# Patient Record
Sex: Male | Born: 1980
Health system: Southern US, Community
[De-identification: ages and names within clinical notes are randomized; demographics above are authoritative.]

## PROBLEM LIST (undated history)

## (undated) DIAGNOSIS — M549 Dorsalgia, unspecified: Secondary | ICD-10-CM

## (undated) DIAGNOSIS — F909 Attention-deficit hyperactivity disorder, unspecified type: Secondary | ICD-10-CM

## (undated) DIAGNOSIS — J45909 Unspecified asthma, uncomplicated: Secondary | ICD-10-CM

## (undated) DIAGNOSIS — T7840XA Allergy, unspecified, initial encounter: Secondary | ICD-10-CM

## (undated) HISTORY — PX: OTHER SURGICAL HISTORY: SHX169

## (undated) HISTORY — PX: TYMPANOSTOMY TUBE PLACEMENT: SHX32

## (undated) HISTORY — DX: Attention-deficit hyperactivity disorder, unspecified type: F90.9

## (undated) HISTORY — DX: Allergy, unspecified, initial encounter: T78.40XA

## (undated) HISTORY — DX: Unspecified asthma, uncomplicated: J45.909

---
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
## 2005-05-19 ENCOUNTER — Encounter: Admission: RE | Admit: 2005-05-19 | Discharge: 2005-05-19 | Payer: Self-pay | Admitting: Family Medicine

## 2005-09-25 ENCOUNTER — Encounter: Admission: RE | Admit: 2005-09-25 | Discharge: 2005-09-25 | Payer: Self-pay | Admitting: Emergency Medicine

## 2007-04-01 ENCOUNTER — Encounter: Admission: RE | Admit: 2007-04-01 | Discharge: 2007-04-01 | Payer: Self-pay | Admitting: Family Medicine

## 2007-05-08 ENCOUNTER — Encounter: Admission: RE | Admit: 2007-05-08 | Discharge: 2007-05-08 | Payer: Self-pay | Admitting: Family Medicine

## 2007-06-28 IMAGING — CT CT ABDOMEN W/ CM
1 of 5 series · 14 of 36 positions shown, 19 images · IV contrast (READICAT/WATER & [ID] OMNI 300)
Comparison: None.

CLINICAL DATA: Abdominal pain for two months. 
 ABDOMEN CT WITH CONTRAST:
TECHNIQUE: Multidetector CT imaging of the abdomen was performed following the standard protocol during bolus administration of intravenous contrast.
 Contrast:  100 cc Omnipaque 300
TECHNIQUE: Multidetector CT imaging of the pelvis was performed following the standard protocol during bolus administration of intravenous contrast.

[Series 2: routine abdomen · axial · 0.73mm/px · z∈[-411,-56]mm · 14 of 81 slices shown, 19 images]
[im 5/81  soft-tissue]
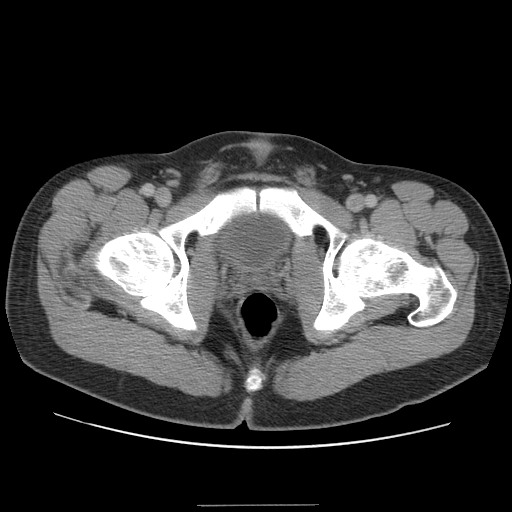
[im 5/81  bone]
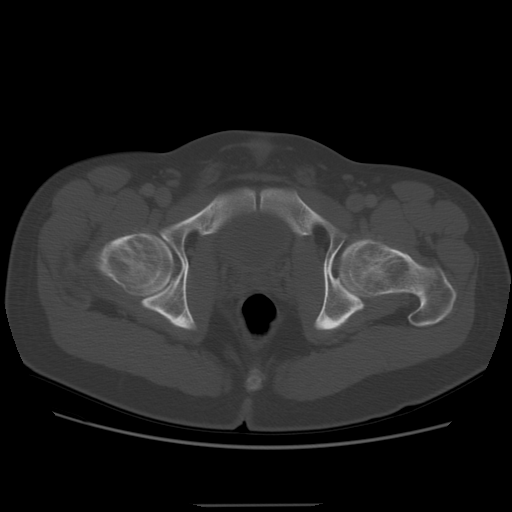
[im 10/81  soft-tissue]
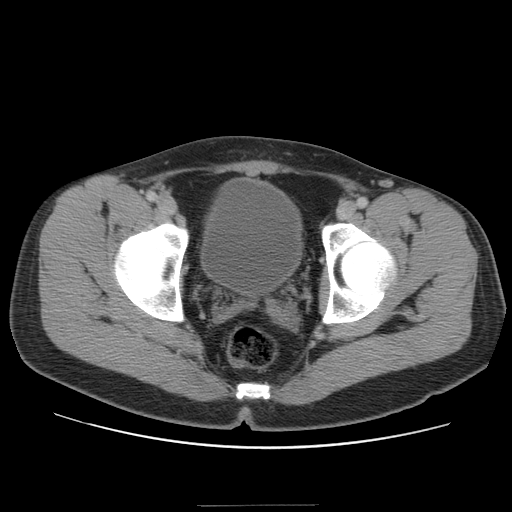
[im 19/81  soft-tissue]
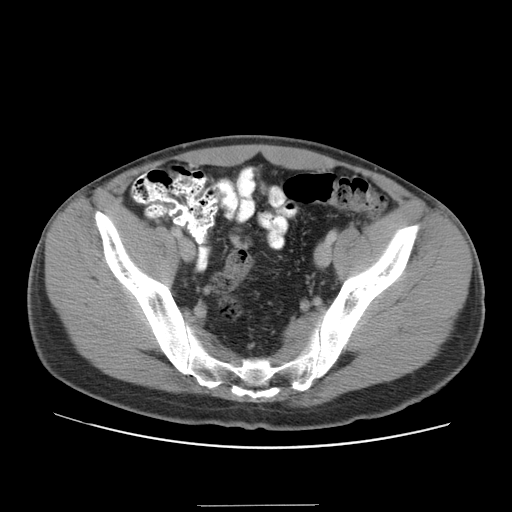
[im 24/81  soft-tissue]
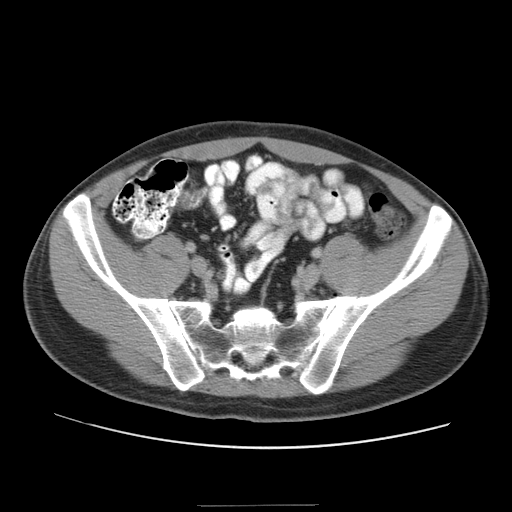
[im 29/81  soft-tissue]
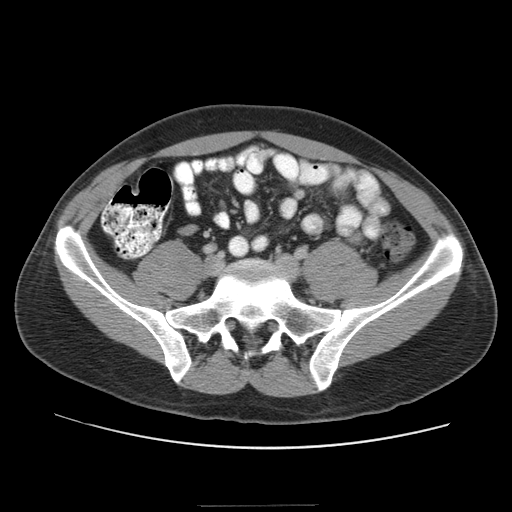
[im 33/81  soft-tissue]
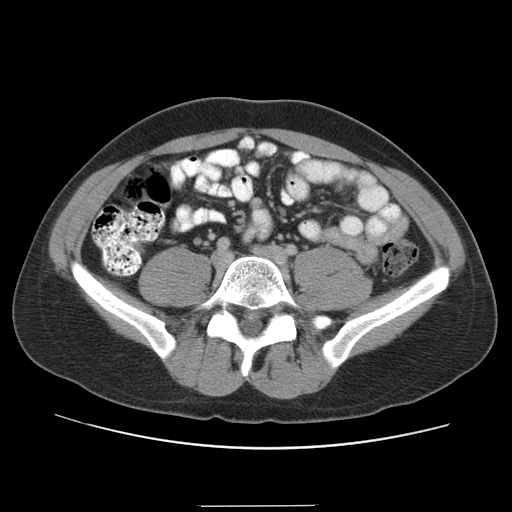
[im 43/81  soft-tissue]
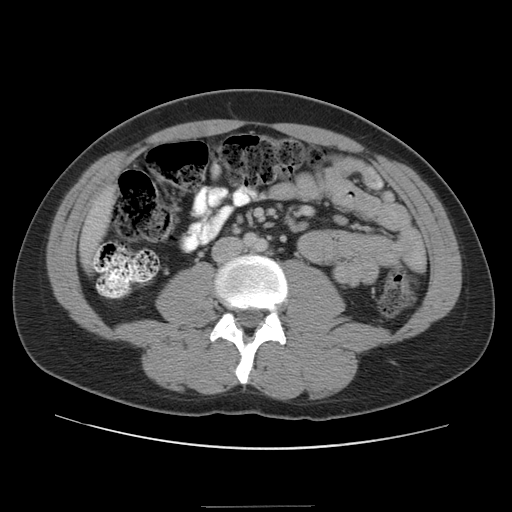
[im 48/81  soft-tissue]
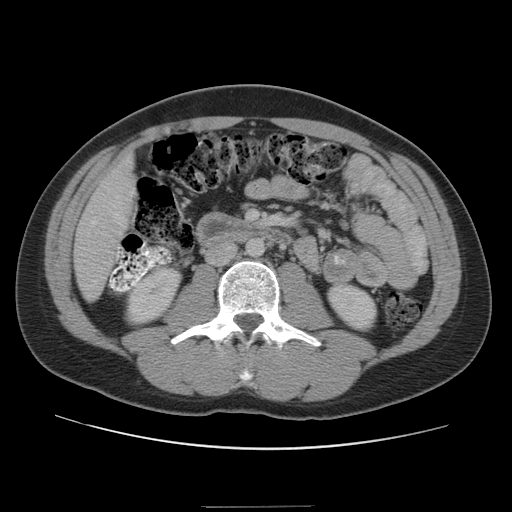
[im 52/81  soft-tissue]
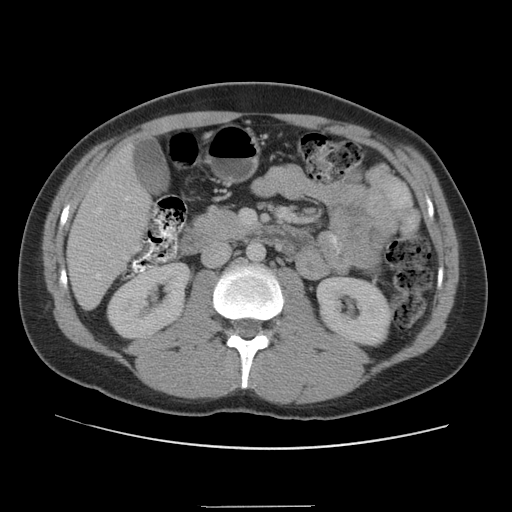
[im 52/81  bone]
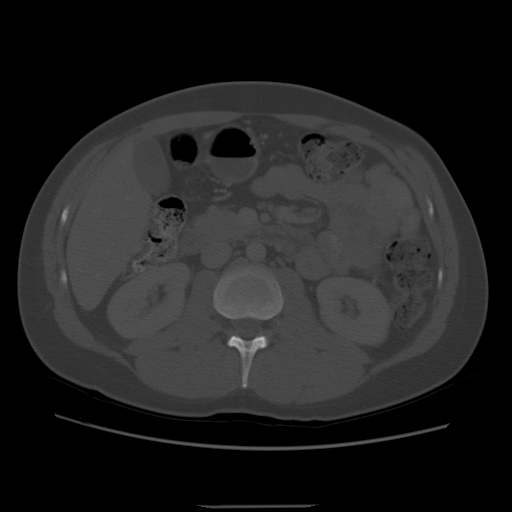
[im 57/81  soft-tissue]
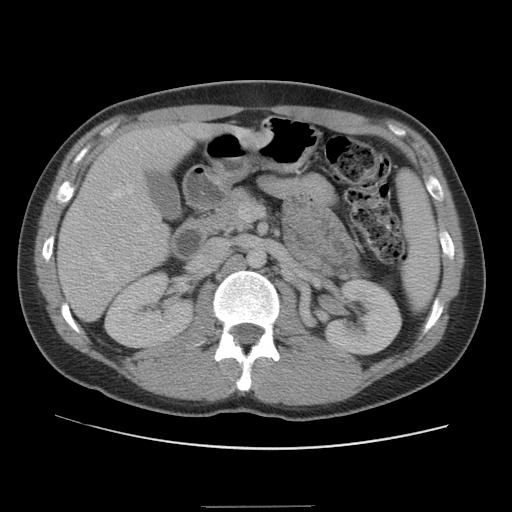
[im 62/81  soft-tissue]
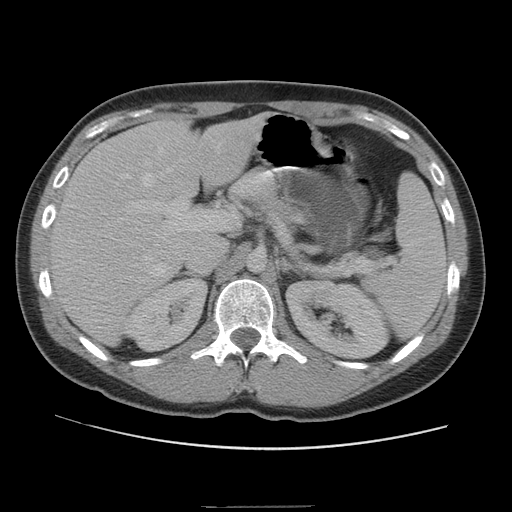
[im 62/81  lung]
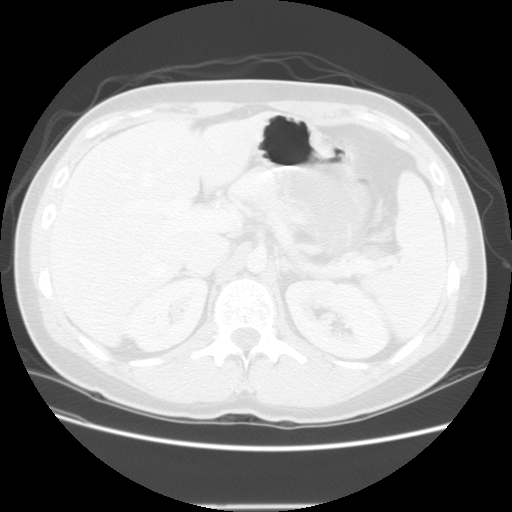
[im 66/81  lung]
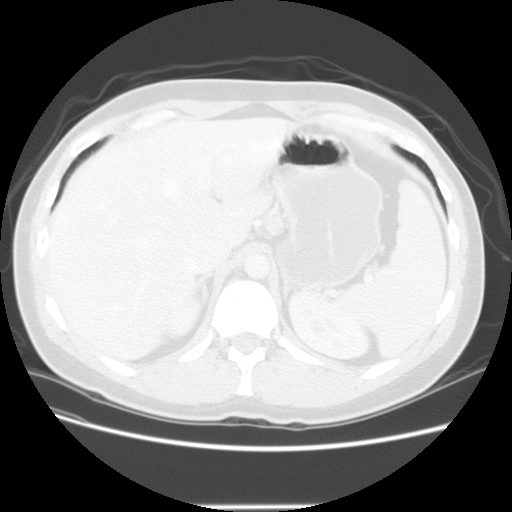
[im 71/81  soft-tissue]
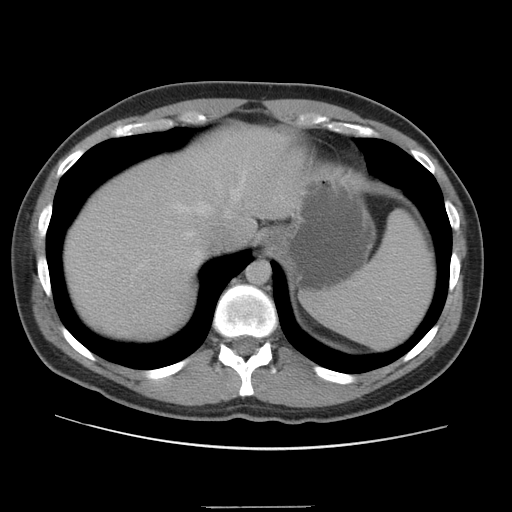
[im 71/81  lung]
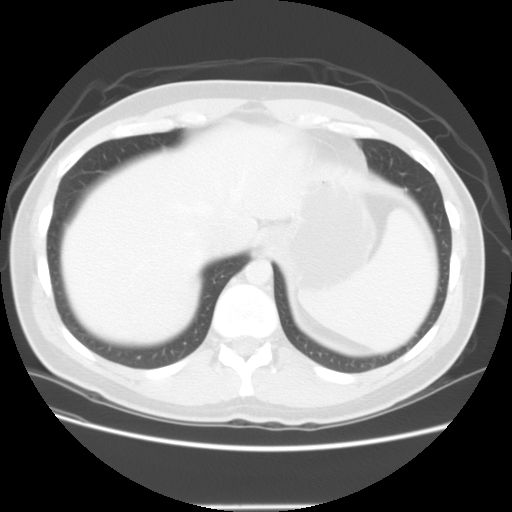
[im 76/81  soft-tissue]
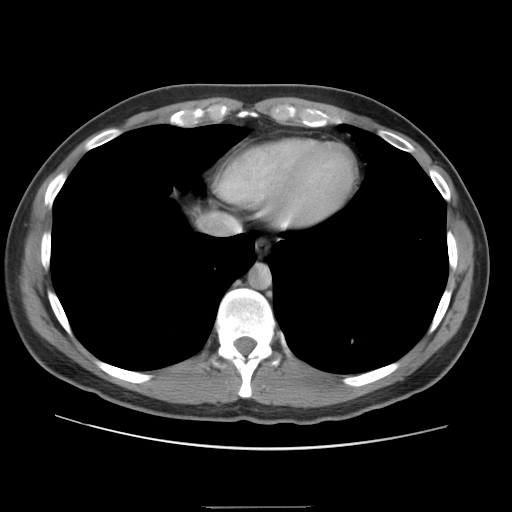
[im 76/81  lung]
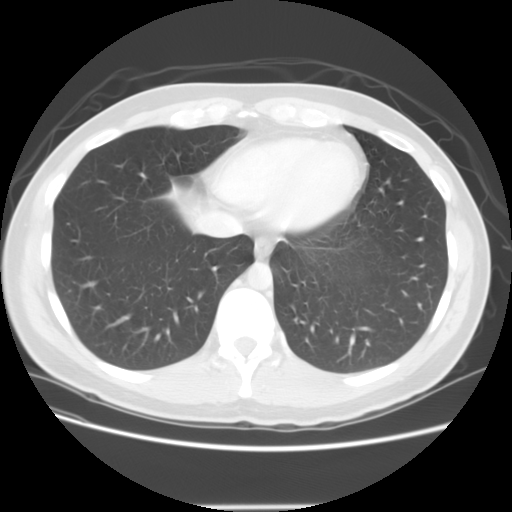

[14 of 36 positions shown; findings below may reference images not displayed]

FINDINGS: Liver is normal in attenuation and morphology. 
 The spleen is negative.  
 The adrenal glands are negative. 
 The right kidney is negative.  The left kidney is negative. 
 No pathologically enlarged retroperitoneal lymph nodes are identified. 
 There are scattered non-pathologically enlarged mesenteric nodes identified.  The largest is seen within the right lower quadrant measuring 20.8 x 6.6 mm (image 52).
IMPRESSION: 1.  No acute findings. 
 2.  Scattered non-pathologically enlarged mesenteric lymph nodes.  Although these are not abnormal by size criteria they are somewhat atypical in their multiplicity.  Clinical correlation recommended. 
 PELVIS CT WITH CONTRAST:
FINDINGS: The visualized bowel loops are negative.  The urinary bladder is unremarkable.  There are no abnormally enlarged inguinal or pelvic lymph nodes.  No free abdominal fluid.
IMPRESSION: No acute findings.

## 2010-01-10 ENCOUNTER — Encounter: Admission: RE | Admit: 2010-01-10 | Discharge: 2010-01-10 | Payer: Self-pay | Admitting: Internal Medicine

## 2010-04-10 ENCOUNTER — Emergency Department (HOSPITAL_COMMUNITY): Admission: EM | Admit: 2010-04-10 | Discharge: 2010-04-10 | Source: Home / Self Care | Admitting: Emergency Medicine

## 2011-12-26 ENCOUNTER — Other Ambulatory Visit: Payer: BC Managed Care – PPO

## 2011-12-31 ENCOUNTER — Ambulatory Visit (INDEPENDENT_AMBULATORY_CARE_PROVIDER_SITE_OTHER): Payer: BC Managed Care – PPO | Admitting: Family Medicine

## 2011-12-31 ENCOUNTER — Encounter: Payer: Self-pay | Admitting: Family Medicine

## 2011-12-31 VITALS — BP 128/80 | HR 96 | Temp 98.2°F | Resp 20 | Ht 72.5 in | Wt 207.5 lb

## 2011-12-31 DIAGNOSIS — F909 Attention-deficit hyperactivity disorder, unspecified type: Secondary | ICD-10-CM

## 2011-12-31 DIAGNOSIS — F411 Generalized anxiety disorder: Secondary | ICD-10-CM

## 2011-12-31 DIAGNOSIS — F419 Anxiety disorder, unspecified: Secondary | ICD-10-CM

## 2011-12-31 HISTORY — DX: Attention-deficit hyperactivity disorder, unspecified type: F90.9

## 2011-12-31 MED ORDER — SERTRALINE HCL 50 MG PO TABS: 50.0000 mg | ORAL_TABLET | Freq: Every day | ORAL | Status: DC

## 2011-12-31 NOTE — Progress Notes (Signed)
Nature conservation officer at [facility] [address] [place] [address] Phone: [phone] Fax: [phone]  Date:  12/31/2011   Name:  Craig Woodward   DOB:  [birth year]   MRN:  [id number] Gender: male Age: 31 y.o.  PCP:  Hannah Beat, MD    Chief Complaint: Establish Care   History of Present Illness:  Craig Woodward is a 31 y.o. pleasant patient who presents with the following:  [place]. Tomi Bamberger moved down to [place]. Had a sinus infection last week.   Has an inhaler at the house. Does smoke cigarettes. Has tried chantix and pathces.  Had some issues with his separation. Quit with hupnosis.   Started on Adderrall - was on Ritalin as a child Was having trouble and without the adderrall for two months.  Was on Ritalin during M, HS.   Xanax 0.5 mg, has been taking it for years. 8-9 years.  Seroquel, zoloft.   There is no problem list on file for this patient.   Past Medical History  Diagnosis Date  . Asthma     Past Surgical History  Procedure Date  . Tubes in ears     History  Substance Use Topics  . Smoking status: Current Every Day Smoker -- 1.0 packs/day  . Smokeless tobacco: Not on file  . Alcohol Use: Yes    Family History  Problem Relation Age of Onset  . Heart disease Mother   . Stroke Mother   . Hypertension Mother   . Diabetes Mother   . Hypertension Father   . Diabetes Father   . Hypertension Maternal Aunt   . Heart disease Maternal Grandmother   . Cancer Maternal Grandfather     lung CA    No Known Allergies  Medication list has been reviewed and updated.  No outpatient prescriptions prior to visit.    Review of Systems:  No fever, chills or sweats. Not acutely anxious.  Physical Examination: Filed Vitals:   12/31/11 1412  BP: 128/80  Pulse: 96  Temp: 98.2 F (36.8 C)  Resp: 20   Filed Vitals:   12/31/11 1412  Height: 6' 0.5" (1.842 m)  Weight: 207 lb 8 oz (94.121 kg)   Body mass  index is 27.76 kg/(m^2). Ideal Body Weight: Weight in (lb) to have BMI = 25: 186.5    GEN: WDWN, NAD, Non-toxic, A & O x 3 HEENT: Atraumatic, Normocephalic. Neck supple. No masses, No LAD. Ears and Nose: No external deformity. CV: RRR, No M/G/R. No JVD. No thrill. No extra heart sounds. PULM: CTA B, no wheezes, crackles, rhonchi. No retractions. No resp. distress. No accessory muscle use. EXTR: No c/c/e NEURO Normal gait.  PSYCH: Normally interactive. Conversant. Not depressed or anxious appearing.  Calm demeanor. ;   Assessment and Plan:  1. ADHD (attention deficit hyperactivity disorder)   2. Anxiety     >30 minutes spent in face to face time with patient, >50% spent in counselling or coordination of care: The patient has recently been in trouble with the law. He did not tell me this initially, but he did get in some trouble with a male that he knows feeling some narcotics under his name. He denies taking them himself. He does tell me that he had a difficult time with taking too many Xanax in the past. I discussed this with one of our partners, and I do not really think it is in his best interest her me to be prescribing him  controlled substances at this time. We are going to do a trial of Zoloft and I will recheck him in one month.  Orders Today:  No orders of the defined types were placed in this encounter.    Updated Medication List: (Includes new medications, updates to list, dose adjustments) Meds ordered this encounter  Medications  . DISCONTD: ALPRAZolam (XANAX) 0.5 MG tablet    Sig: Take 0.5 mg by mouth daily as needed.  Marland Kitchen DISCONTD: amphetamine-dextroamphetamine (ADDERALL XR) 30 MG 24 hr capsule    Sig: Take 30 mg by mouth every morning.  Marland Kitchen DISCONTD: amphetamine-dextroamphetamine (ADDERALL) 10 MG tablet    Sig: Take 10 mg by mouth daily.  . sertraline (ZOLOFT) 50 MG tablet    Sig: Take 1 tablet (50 mg total) by mouth daily.    Dispense:  30 tablet    Refill:  5     Medications Discontinued: Medications Discontinued During This Encounter  Medication Reason  . ALPRAZolam (XANAX) 0.5 MG tablet   . amphetamine-dextroamphetamine (ADDERALL XR) 30 MG 24 hr capsule   . amphetamine-dextroamphetamine (ADDERALL) 10 MG tablet      Hannah Beat, MD,

## 2012-01-01 ENCOUNTER — Encounter (HOSPITAL_COMMUNITY): Payer: Self-pay | Admitting: Emergency Medicine

## 2012-01-01 ENCOUNTER — Emergency Department (INDEPENDENT_AMBULATORY_CARE_PROVIDER_SITE_OTHER)
Admission: EM | Admit: 2012-01-01 | Discharge: 2012-01-01 | Disposition: A | Discharge status: Home / Self Care | Payer: BC Managed Care – PPO | Source: Home / Self Care | Attending: Family Medicine | Admitting: Family Medicine

## 2012-01-01 DIAGNOSIS — L03114 Cellulitis of left upper limb: Secondary | ICD-10-CM

## 2012-01-01 DIAGNOSIS — IMO0002 Reserved for concepts with insufficient information to code with codable children: Secondary | ICD-10-CM

## 2012-01-01 MED ORDER — CEPHALEXIN 500 MG PO CAPS: 500.0000 mg | ORAL_CAPSULE | Freq: Four times a day (QID) | ORAL | Status: DC

## 2012-01-01 MED ORDER — FLUTICASONE PROPIONATE 0.05 % EX CREA: TOPICAL_CREAM | Freq: Two times a day (BID) | CUTANEOUS | Status: DC

## 2012-01-01 NOTE — ED Provider Notes (Signed)
History     CSN: [id number]  Arrival date & time 01/01/12  1946   First MD Initiated Contact with Patient 01/01/12 1958      Chief Complaint  Patient presents with  . Insect Bite    (Consider location/radiation/quality/duration/timing/severity/associated sxs/prior treatment) Patient is a 31 y.o. male presenting with rash. The history is provided by the patient.  Rash  This is a new problem. The current episode started more than 2 days ago. The problem has been gradually worsening. The problem is associated with an insect bite/sting. There has been no fever. The rash is present on the left arm. The pain is mild. Associated symptoms include itching and pain.    Past Medical History  Diagnosis Date  . Asthma   . ADHD (attention deficit hyperactivity disorder) 12/31/2011    Past Surgical History  Procedure Date  . Tubes in ears     Family History  Problem Relation Age of Onset  . Heart disease Mother   . Stroke Mother   . Hypertension Mother   . Diabetes Mother   . Hypertension Father   . Diabetes Father   . Hypertension Maternal Aunt   . Heart disease Maternal Grandmother   . Cancer Maternal Grandfather     lung CA    History  Substance Use Topics  . Smoking status: Current Every Day Smoker -- 1.0 packs/day  . Smokeless tobacco: Not on file  . Alcohol Use: Yes      Review of Systems  Constitutional: Negative.   Skin: Positive for itching and rash.    Allergies  Review of patient's allergies indicates no known allergies.  Home Medications   Current Outpatient Rx  Name Route Sig Dispense Refill  . SERTRALINE HCL 50 MG PO TABS Oral Take 1 tablet (50 mg total) by mouth daily. 30 tablet 5  . CEPHALEXIN 500 MG PO CAPS Oral Take 1 capsule (500 mg total) by mouth 4 (four) times daily. Take all of medicine and drink lots of fluids 28 capsule 0  . FLUTICASONE PROPIONATE 0.05 % EX CREA Topical Apply topically 2 (two) times daily. 60 g 0    BP 121/67  Pulse 115   Temp 98.6 F (37 C) (Oral)  Resp 18  SpO2 99%  Physical Exam  Nursing note and vitals reviewed. Constitutional: He is oriented to person, place, and time. He appears well-developed and well-nourished.  Neurological: He is alert and oriented to person, place, and time.  Skin: Skin is warm and dry. Rash noted. There is erythema.       ED Course  Procedures (including critical care time)  Labs Reviewed - No data to display No results found.   1. Cellulitis of arm, left       MDM          Linna Hoff, MD 01/01/12 2104

## 2012-01-01 NOTE — ED Notes (Addendum)
?   Insect bite to left arm. Left forearm starting at elbow is red and swollen and hot to touch. Redness and swelling has spread through out forearm. Pt states that it started out as a bump at first but over the past four days it has gotten progressively worse. Pain with touch/pressure.   Pt also has other spots on leg and right arm that started out the same way but has since healed but only affected small area no spreading.

## 2012-01-31 ENCOUNTER — Ambulatory Visit: Payer: BC Managed Care – PPO | Admitting: Family Medicine

## 2012-05-08 ENCOUNTER — Ambulatory Visit (INDEPENDENT_AMBULATORY_CARE_PROVIDER_SITE_OTHER): Payer: BC Managed Care – PPO | Admitting: Family Medicine

## 2012-05-08 ENCOUNTER — Ambulatory Visit: Payer: BC Managed Care – PPO

## 2012-05-08 VITALS — BP 127/75 | HR 94 | Temp 99.8°F | Resp 18 | Ht 73.0 in | Wt 202.6 lb

## 2012-05-08 DIAGNOSIS — R059 Cough, unspecified: Secondary | ICD-10-CM

## 2012-05-08 DIAGNOSIS — R05 Cough: Secondary | ICD-10-CM

## 2012-05-08 DIAGNOSIS — R509 Fever, unspecified: Secondary | ICD-10-CM

## 2012-05-08 MED ORDER — HYDROCODONE-ACETAMINOPHEN 10-300 MG/15ML PO SOLN: 10.0000 mg | Freq: Every evening | ORAL | Status: DC | PRN

## 2012-05-08 MED ORDER — AZITHROMYCIN 500 MG PO TABS: 500.0000 mg | ORAL_TABLET | Freq: Every day | ORAL | Status: DC

## 2012-05-08 NOTE — Patient Instructions (Signed)
Take the Lortab elixir at night to help with cough and for sleep.  Do not take this medication and drive.   Take the Azithromycin 1 pill a day for the next 5 days.    Come back and see [place] next week if you're not having any improvement.

## 2012-05-08 NOTE — Progress Notes (Signed)
Patient ID: Craig Woodward, male   DOB: 06-Mar-1981, 32 y.o.   MRN: [id number] Craig Woodward is a 32 y.o. male who presents to Urgent Care today for cough and fever/chills for past 7 - 10 days.    1.  Cough and fever:  Patient has had increasing cough for the past week or so.  Subjective fevers.  Cough worse at night.  Describes chills throughout the day.  Cough keeps him up at night.  Had URI symptoms which started about a week before cough, these are still present but cough is what's causing him most concern.  Right sided pleuritic pain with deep cough.  No chest pain on exertion or at rest. No nausea/vomiting.    Patient has been working 12 and 13 hour days for past week.  Comes home and reports malaise, goes straight to sleep.    PMH reviewed.   ROS as above otherwise neg.   Medications reviewed. Current Outpatient Prescriptions  Medication Sig Dispense Refill  . cephALEXin (KEFLEX) 500 MG capsule Take 1 capsule (500 mg total) by mouth 4 (four) times daily. Take all of medicine and drink lots of fluids  28 capsule  0  . fluticasone (CUTIVATE) 0.05 % cream Apply topically 2 (two) times daily.  60 g  0  . sertraline (ZOLOFT) 50 MG tablet Take 1 tablet (50 mg total) by mouth daily.  30 tablet  5    Exam:  BP 127/75  Pulse 94  Temp 99.8 F (37.7 C) (Oral)  Resp 18  Ht 6\' 1"  (1.854 m)  Wt 202 lb 9.6 oz (91.899 kg)  BMI 26.73 kg/m2  SpO2 96% Gen: Mildly ill-appearing, sweating.  Clammy appearing.  Head: Normocephalic atraumatic Eyes: EOMI, PERRL, sclera and conjunctiva non-erythematous Ears:  Canals clear bilaterally.  TMs pearly gray bilaterally without erythema or bulging.   Nose:  Nares patent, some exudates noted  Mouth: Mucosa membranes moist. Tonsils +2, nonenlarged, non-erythematous. Neck: No cervical lymphadenopathy noted Heart:  RRR, no murmurs auscultated. Pulm:  Poor inspiratory effort.  No wheezing, no crackles noted  [facility] reading (PRIMARY) by  Dr. Gwendolyn Grant: Questionable  Right opacity RLL     Assessment and Plan:  1.  Cough and fever, possibility of PNA based on CXR: - Await read by radiologist -- will not change management. 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  Can return on Monday  - To return to clinic if any further concerns or no improvement by late next week

## 2012-05-09 NOTE — Progress Notes (Signed)
I have read, reviewed, and agree with plan of care by Dr. Walden  

## 2012-05-13 ENCOUNTER — Ambulatory Visit (INDEPENDENT_AMBULATORY_CARE_PROVIDER_SITE_OTHER): Payer: BC Managed Care – PPO | Admitting: Physician Assistant

## 2012-05-13 VITALS — BP 138/79 | HR 99 | Temp 97.7°F | Resp 18 | Ht 73.5 in | Wt 203.8 lb

## 2012-05-13 DIAGNOSIS — R059 Cough, unspecified: Secondary | ICD-10-CM

## 2012-05-13 DIAGNOSIS — R05 Cough: Secondary | ICD-10-CM

## 2012-05-13 LAB — POCT CBC
Hemoglobin: 15.8 g/dL (ref 14.1–18.1)
Lymph, poc: 6.7 — AB (ref 0.6–3.4)
MCH, POC: 31.5 pg — AB (ref 27–31.2)
MCHC: 33.4 g/dL (ref 31.8–35.4)
MCV: 94.5 fL (ref 80–97)
MID (cbc): 1.1 — AB (ref 0–0.9)
MPV: 7.5 fL (ref 0–99.8)
POC Granulocyte: 4.3 (ref 2–6.9)
POC LYMPH PERCENT: 55.5 %L — AB (ref 10–50)
POC MID %: 8.9 %M (ref 0–12)
Platelet Count, POC: 283 10*3/uL (ref 142–424)
RBC: 5.01 M/uL (ref 4.69–6.13)
RDW, POC: 14.3 %
WBC: 12 10*3/uL — AB (ref 4.6–10.2)

## 2012-05-13 LAB — GLUCOSE, POCT (MANUAL RESULT ENTRY): POC Glucose: 126 mg/dl — AB (ref 70–99)

## 2012-05-13 MED ORDER — HYDROCOD POLST-CHLORPHEN POLST 10-8 MG/5ML PO LQCR: 5.0000 mL | Freq: Two times a day (BID) | ORAL | Status: DC

## 2012-05-13 MED ORDER — PREDNISONE 10 MG PO TABS: ORAL_TABLET | ORAL | Status: DC

## 2012-05-13 MED ORDER — GUAIFENESIN ER 1200 MG PO TB12: 1.0000 | ORAL_TABLET | Freq: Two times a day (BID) | ORAL | Status: DC

## 2012-05-13 NOTE — Progress Notes (Signed)
[address], [place] [address]   Phone [phone]  Subjective:    Patient ID: Craig Woodward, male    DOB: [birth year], 32 y.o.   MRN: [id number]  HPI Pt presents to clinic with continued cough, he is worries that he has finished his abx and he has been sick for 3 wks.  He is feeling only slightly better since being seen 5 days ago.  This am he woke up and felt like something was in his throat, a tickle and he coughed so hard he could hardly catch his breath.  He has no SOB otherwise.  His nasal congestion is better.   His sinus congestion is mostly resolved.   Review of Systems  Constitutional: Negative for fever and chills.  HENT: Positive for congestion (improved). Negative for sore throat, rhinorrhea, neck stiffness and postnasal drip (not aware of this though he feels like something is stuck in the throat).   Respiratory: Positive for cough. Negative for shortness of breath and wheezing.   Gastrointestinal: Negative for nausea.       Objective:   Physical Exam  Vitals reviewed. Constitutional: He is oriented to person, place, and time. He appears well-developed and well-nourished.  Pt not coughing in the room.  He appears much older than stated age.  HENT:  Head: Normocephalic and atraumatic.  Right Ear: Hearing, tympanic membrane, external ear and ear canal normal.  Left Ear: Hearing, tympanic membrane, external ear and ear canal normal.  Nose: Nose normal.  Mouth/Throat: Uvula is midline and oropharynx is clear and moist.  Eyes: Conjunctivae are normal. Pupils are equal, round, and reactive to light.  Neck: Neck supple.  Cardiovascular: Normal rate, regular rhythm and normal heart sounds.   Pulmonary/Chest: Effort normal and breath sounds normal.  Cough with deep inspirations and forced expiration.  No wheezing.  Neurological: He is alert and oriented to person, place, and time.  Skin: Skin is warm and dry.  Psychiatric: He has a normal mood and affect. His behavior  is normal. Judgment and thought content normal.    Results for orders placed in visit on 05/13/12  POCT CBC      Result Value Range   WBC 12.0 (*) 4.6 - 10.2 K/uL   Lymph, poc 6.7 (*) 0.6 - 3.4   POC LYMPH PERCENT 55.5 (*) 10 - 50 %L   MID (cbc) 1.1 (*) 0 - 0.9   POC MID % 8.9  0 - 12 %M   POC Granulocyte 4.3  2 - 6.9   Granulocyte percent 35.6 (*) 37 - 80 %G   RBC 5.01  4.69 - 6.13 M/uL   Hemoglobin 15.8  14.1 - 18.1 g/dL   HCT, POC 66.4  40.3 - 53.7 %   MCV 94.5  80 - 97 fL   MCH, POC 31.5 (*) 27 - 31.2 pg   MCHC 33.4  31.8 - 35.4 g/dL   RDW, POC 47.4     Platelet Count, POC 283  142 - 424 K/uL   MPV 7.5  0 - 99.8 fL  GLUCOSE, POCT (MANUAL RESULT ENTRY)      Result Value Range   POC Glucose 126 (*) 70 - 99 mg/dl         Assessment & Plan:   1. Cough  POCT CBC   POCT CBC   POCT glucose (manual entry)   Guaifenesin (MUCINEX MAXIMUM STRENGTH) 1200 MG TB12   chlorpheniramine-HYDROcodone (TUSSIONEX PENNKINETIC ER) 10-8 MG/5ML LQCR   predniSONE (DELTASONE)  10 MG tablet   This is probably a post viral inflammatory cough.  Even with elevated WBC there is a shift towards viral etiology.  Will treat with steroids and more cough meds (did long acting b/c pt felt like it ran out early).  Will f/u if problems.

## 2012-11-11 ENCOUNTER — Other Ambulatory Visit: Payer: Self-pay | Admitting: Physician Assistant

## 2013-08-31 ENCOUNTER — Emergency Department (HOSPITAL_COMMUNITY)
Admission: EM | Admit: 2013-08-31 | Discharge: 2013-08-31 | Disposition: A | Discharge status: Home / Self Care | Payer: BC Managed Care – PPO | Attending: Emergency Medicine | Admitting: Emergency Medicine

## 2013-08-31 ENCOUNTER — Encounter (HOSPITAL_COMMUNITY): Payer: Self-pay | Admitting: Emergency Medicine

## 2013-08-31 DIAGNOSIS — Z79899 Other long term (current) drug therapy: Secondary | ICD-10-CM | POA: Insufficient documentation

## 2013-08-31 DIAGNOSIS — Z8659 Personal history of other mental and behavioral disorders: Secondary | ICD-10-CM | POA: Insufficient documentation

## 2013-08-31 DIAGNOSIS — F1210 Cannabis abuse, uncomplicated: Secondary | ICD-10-CM | POA: Insufficient documentation

## 2013-08-31 DIAGNOSIS — F17200 Nicotine dependence, unspecified, uncomplicated: Secondary | ICD-10-CM | POA: Insufficient documentation

## 2013-08-31 DIAGNOSIS — F1410 Cocaine abuse, uncomplicated: Secondary | ICD-10-CM | POA: Insufficient documentation

## 2013-08-31 DIAGNOSIS — F1010 Alcohol abuse, uncomplicated: Secondary | ICD-10-CM

## 2013-08-31 DIAGNOSIS — J45909 Unspecified asthma, uncomplicated: Secondary | ICD-10-CM | POA: Insufficient documentation

## 2013-08-31 LAB — COMPREHENSIVE METABOLIC PANEL
ALK PHOS: 85 U/L (ref 39–117)
ALT: 14 U/L (ref 0–53)
AST: 14 U/L (ref 0–37)
Albumin: 4.6 g/dL (ref 3.5–5.2)
BUN: 13 mg/dL (ref 6–23)
CO2: 25 mEq/L (ref 19–32)
Calcium: 9.7 mg/dL (ref 8.4–10.5)
Chloride: 99 mEq/L (ref 96–112)
Creatinine, Ser: 0.95 mg/dL (ref 0.50–1.35)
GFR calc Af Amer: >90 mL/min (ref >90)
Glucose, Bld: 127 mg/dL — ABNORMAL HIGH (ref 70–99)
Potassium: 3.6 mEq/L — ABNORMAL LOW (ref 3.7–5.3)
SODIUM: 141 meq/L (ref 137–147)
Total Bilirubin: 0.4 mg/dL (ref 0.3–1.2)
Total Protein: 7.7 g/dL (ref 6.0–8.3)

## 2013-08-31 LAB — ACETAMINOPHEN LEVEL

## 2013-08-31 LAB — CBC
HCT: 51.2 % (ref 39.0–52.0)
Hemoglobin: 18.2 g/dL — ABNORMAL HIGH (ref 13.0–17.0)
MCH: 34 pg (ref 26.0–34.0)
MCHC: 35.5 g/dL (ref 30.0–36.0)
MCV: 95.7 fL (ref 78.0–100.0)
Platelets: 209 10*3/uL (ref 150–400)
RBC: 5.35 MIL/uL (ref 4.22–5.81)
RDW: 14.1 % (ref 11.5–15.5)
WBC: 9.6 10*3/uL (ref 4.0–10.5)

## 2013-08-31 LAB — SALICYLATE LEVEL: Salicylate Lvl: <2 mg/dL — ABNORMAL LOW (ref 2.8–20.0)

## 2013-08-31 LAB — RAPID URINE DRUG SCREEN, HOSP PERFORMED
Amphetamines: NOT DETECTED
Barbiturates: NOT DETECTED
Benzodiazepines: NOT DETECTED
COCAINE: POSITIVE — AB
Opiates: NOT DETECTED
TETRAHYDROCANNABINOL: POSITIVE — AB

## 2013-08-31 LAB — ETHANOL: Alcohol, Ethyl (B): <11 mg/dL (ref 0–11)

## 2013-08-31 MED ORDER — ACETAMINOPHEN 325 MG PO TABS: 650.0000 mg | ORAL_TABLET | ORAL | Status: DC | PRN

## 2013-08-31 MED ORDER — IBUPROFEN 400 MG PO TABS: 600.0000 mg | ORAL_TABLET | Freq: Three times a day (TID) | ORAL | Status: DC | PRN

## 2013-08-31 MED ORDER — THIAMINE HCL 100 MG/ML IJ SOLN: 100.0000 mg | Freq: Every day | INTRAMUSCULAR | Status: DC

## 2013-08-31 MED ORDER — LORAZEPAM 1 MG PO TABS: 0.0000 mg | ORAL_TABLET | Freq: Four times a day (QID) | ORAL | Status: DC

## 2013-08-31 MED ORDER — LORAZEPAM 1 MG PO TABS: 0.0000 mg | ORAL_TABLET | Freq: Two times a day (BID) | ORAL | Status: DC

## 2013-08-31 MED ORDER — ONDANSETRON HCL 4 MG PO TABS: 4.0000 mg | ORAL_TABLET | Freq: Three times a day (TID) | ORAL | Status: DC | PRN

## 2013-08-31 MED ORDER — NICOTINE 21 MG/24HR TD PT24
21.0000 mg | MEDICATED_PATCH | Freq: Every day | TRANSDERMAL | Status: DC
  Administered 2013-08-31: 21 mg via TRANSDERMAL
  Filled 2013-08-31: qty 1

## 2013-08-31 MED ORDER — VITAMIN B-1 100 MG PO TABS
100.0000 mg | ORAL_TABLET | Freq: Every day | ORAL | Status: DC
  Administered 2013-08-31: 100 mg via ORAL
  Filled 2013-08-31: qty 1

## 2013-08-31 NOTE — ED Notes (Addendum)
Patients sts tried to detox on own in Nov. 2014 patient went through withdrawal symptoms "shakes and shivers" and "said I can't deal with this and drank some beer".  Sister sts patient called on Thursday and asked to get help. Pt has not been through a detox program before but is ready to try now but needs assistance.

## 2013-08-31 NOTE — ED Notes (Addendum)
While walking to bathroom, pt expressed to Madelaine Bhat, EMT that he recently used cocaine x2 days ago but did not want family member to know. EMT notified this RN. RN went into pt room and asked pt if it was ok to speak in front of family, pt gave consent. When asked about illicit drug use pt denied. Pt placed on heart monitor.

## 2013-08-31 NOTE — ED Notes (Signed)
Melissa RN ([facility]) sts patient is accepted and can arrive by sister to facility at 9:30 PM.

## 2013-08-31 NOTE — ED Notes (Signed)
Melissa RN ([facility]) speaking to patient regarding admission

## 2013-08-31 NOTE — ED Provider Notes (Signed)
CSN: [id number]     Arrival date & time 08/31/13  1032 History   First MD Initiated Contact with Patient 08/31/13 1127     Chief Complaint  Patient presents with  . Medical Clearance  . Alcohol Problem     (Consider location/radiation/quality/duration/timing/severity/associated sxs/prior Treatment) HPI Comments: Patient presents today with a chief complaint of alcohol abuse.  He reports that he typically drinks 12-18 beers/day everyday.  Last drink was yesterday morning.  He reports abusing alcohol for the past 2.5 years.  He is requesting inpatient alcohol rehabilitation at this time.  He denies hallucinations or tremors.  No prior history of Seizures or DT's.  He reports some associated nausea, but denies vomiting.  He also reports cold sweats.  Denies SI or HI.  He also reports associated Cocaine use.  Last use was two days ago.  Patient is a 33 y.o. male presenting with alcohol problem. The history is provided by the patient.  Alcohol Problem    Past Medical History  Diagnosis Date  . ADHD (attention deficit hyperactivity disorder) 12/31/2011  . Allergy   . Asthma     childhood   Past Surgical History  Procedure Laterality Date  . Tubes in ears    . Tympanostomy tube placement     Family History  Problem Relation Age of Onset  . Heart disease Mother   . Stroke Mother   . Hypertension Mother   . Diabetes Mother   . Hypertension Father   . Diabetes Father   . Hypertension Maternal Aunt   . Heart disease Maternal Grandmother   . Cancer Maternal Grandfather     lung CA   History  Substance Use Topics  . Smoking status: Current Every Day Smoker -- 1.00 packs/day  . Smokeless tobacco: Not on file  . Alcohol Use: Yes     Comment: 12 to 18 beers    Review of Systems  All other systems reviewed and are negative.     Allergies  Review of patient's allergies indicates no known allergies.  Home Medications   Prior to Admission medications   Medication Sig Start  Date End Date Taking? Authorizing Provider  HYDROcodone-acetaminophen (NORCO) 10-325 MG per tablet Take 1 tablet by mouth every 6 (six) hours as needed (pain).   Yes Historical Provider, MD  naltrexone (DEPADE) 50 MG tablet Take 50 mg by mouth daily. 07/23/13  Yes Historical Provider, MD   BP 142/89  Pulse 89  Temp(Src) 97.8 F (36.6 C) (Oral)  Resp 18  SpO2 97% Physical Exam  Nursing note and vitals reviewed. Constitutional: He appears well-developed and well-nourished.  HENT:  Head: Normocephalic and atraumatic.  Mouth/Throat: Oropharynx is clear and moist.  Neck: Normal range of motion. Neck supple.  Cardiovascular: Normal rate, regular rhythm and normal heart sounds.   Pulmonary/Chest: Effort normal and breath sounds normal.  Musculoskeletal: Normal range of motion.  Neurological: He is alert.  Skin: Skin is warm and dry.  Psychiatric: He has a normal mood and affect.    ED Course  Procedures (including critical care time) Labs Review Labs Reviewed  CBC - Abnormal; Notable for the following:    Hemoglobin 18.2 (*)    All other components within normal limits  ACETAMINOPHEN LEVEL  COMPREHENSIVE METABOLIC PANEL  ETHANOL  SALICYLATE LEVEL  URINE RAPID DRUG SCREEN (HOSP PERFORMED)    Imaging Review No results found.   EKG Interpretation None      MDM   Final diagnoses:  None  Patient presenting today for alcohol detox.  Labs today unremarkable.  Patient denies SI or HI.  TTS consulted.  Psych holding orders placed.  CIWA orders have also been placed.    Santiago GladHeather Lanyia Jewel, PA-C 08/31/13 1938  Santiago GladHeather Stoy Fenn, PA-C 08/31/13 1940

## 2013-08-31 NOTE — ED Notes (Signed)
Dr. Blinda Leatherwood made aware of patients acceptance to [facility]

## 2013-08-31 NOTE — ED Notes (Addendum)
This RN coordinated care between Lind (payment at [facility]) Efraim Kaufmann RN (intake nurse at [facility]) and patient and sister.  Per Hilda Lias patient ED costs will make pt insurance deductible and pt will not have to pay out of pocket cost of $2,100. Melissa RN made aware and sts to complete intake form and fax  [facility] intake form completed and faxed to [facility] from [place]

## 2013-08-31 NOTE — ED Notes (Signed)
Acceptance to [facility] confirmed by [employer]

## 2013-08-31 NOTE — ED Notes (Signed)
Pt family member states she has spoken with Hilda Lias at [facility] in [place], and was told pt needed to come to ER for med clearance and detox and then could be moved to [facility].

## 2013-08-31 NOTE — BH Assessment (Signed)
Tele Assessment Note   Patient is a 33 year old white male requesting detox from alcohol and cocaine.  Patient BAL is <11.    Patient reports that his last drink was Saturday night.  Patient reports that he cannot remember the last time he used cocaine; however his UDS is positive for cocaine and cannabis.  Patient reports that he does not feel as if he has a problem with cocaine or cannabis because he uses this drug infrequently.  Patient reports that he needs help with alcohol.  Patient reports that he has been addicted to alcohol for the past 2 and a half years.  Patient reports that following withdrawal symptoms to include sweats, agitation, tingling.   Patient denies prior detox or treatment for drugs or alcohol.  Patient denies a history of seizures or DT's.  Patient reports that that he receives medication management at [facility].  Patient reports that he has been non-compliant with taking his medication for depression and anxiety.  Patient reports that he has not taken his medication in months.  Patient denies SI/HI/Psychosis.    Axis I: Alcohol Dependence  Axis II: Deferred Axis III:  Past Medical History  Diagnosis Date  . ADHD (attention deficit hyperactivity disorder) 12/31/2011  . Allergy   . Asthma     childhood   Axis IV: economic problems, occupational problems, problems related to legal system/crime, problems related to social environment and problems with primary support group Axis V: 31-40 impairment in reality testing  Past Medical History:  Past Medical History  Diagnosis Date  . ADHD (attention deficit hyperactivity disorder) 12/31/2011  . Allergy   . Asthma     childhood    Past Surgical History  Procedure Laterality Date  . Tubes in ears    . Tympanostomy tube placement      Family History:  Family History  Problem Relation Age of Onset  . Heart disease Mother   . Stroke Mother   . Hypertension Mother   . Diabetes Mother   . Hypertension Father    . Diabetes Father   . Hypertension Maternal Aunt   . Heart disease Maternal Grandmother   . Cancer Maternal Grandfather     lung CA    Social History:  reports that he has been smoking.  He does not have any smokeless tobacco history on file. He reports that he drinks alcohol. He reports that he does not use illicit drugs.  Additional Social History:     CIWA: CIWA-Ar BP: 124/71 mmHg Pulse Rate: 74 Nausea and Vomiting: mild nausea with no vomiting Tactile Disturbances: none Tremor: no tremor Auditory Disturbances: not present Paroxysmal Sweats: barely perceptible sweating, palms moist Visual Disturbances: not present Anxiety: mildly anxious Headache, Fullness in Head: none present Agitation: normal activity Orientation and Clouding of Sensorium: oriented and can do serial additions CIWA-Ar Total: 3 COWS:    Allergies: No Known Allergies  Home Medications:  (Not in a hospital admission)  OB/GYN Status:  No LMP for male patient.  General Assessment Data Location of Assessment: [facility] Is this a Tele or Face-to-Face Assessment?: Tele Assessment Is this an Initial Assessment or a Re-assessment for this encounter?: Initial Assessment Living Arrangements: Alone Can pt return to current living arrangement?: Yes Admission Status: Voluntary Is patient capable of signing voluntary admission?: Yes Transfer from: Acute Hospital [facility] ) Referral Source: Self/Family/Friend  Medical Screening Exam [facility] Walk-[facility] ONLY) Medical Exam completed: Yes  [facility] Crisis Care Plan Living Arrangements:  Alone Name of Psychiatrist: [facility] Name of Therapist: [facility]   Education Status Is patient currently in school?: No Current Grade: NA Highest grade of school patient has completed: NA Name of school: NA Contact person: NA  Risk to self Suicidal Ideation: No Suicidal Intent: No Is patient at risk for suicide?: No Suicidal Plan?:  No Access to Means: No What has been your use of drugs/alcohol within the last 12 months?: Alcohol Previous Attempts/Gestures: No How many times?: 0 Other Self Harm Risks: None Reported Triggers for Past Attempts:  (NA) Intentional Self Injurious Behavior: None Family Suicide History: No Recent stressful life event(s):  (None Reported) Persecutory voices/beliefs?: No Depression: Yes Depression Symptoms: Despondent;Fatigue;Guilt;Feeling worthless/self pity (Depressed because he is not able to stop drinking.) Substance abuse history and/or treatment for substance abuse?: Yes Suicide prevention information given to non-admitted patients: Not applicable  Risk to Others Homicidal Ideation: No Thoughts of Harm to Others: No Current Homicidal Intent: No Current Homicidal Plan: No Access to Homicidal Means: No Identified Victim: None Reported History of harm to others?: No Assessment of Violence: None Noted Violent Behavior Description: None Reported Does patient have access to weapons?: No Criminal Charges Pending?: No Does patient have a court date: No  Psychosis Hallucinations: None noted Delusions: None noted  Mental Status Report Appear/Hygiene: Disheveled Eye Contact: Fair Motor Activity: Freedom of movement Speech: Logical/coherent Level of Consciousness: Alert Mood: Anxious;Depressed Affect: Blunted Anxiety Level: None Thought Processes: Coherent;Relevant Judgement: Unimpaired Orientation: Person;Place;Time;Situation Obsessive Compulsive Thoughts/Behaviors: None  Cognitive Functioning Concentration: Decreased Memory: Recent Intact;Remote Intact IQ: Average Insight: Fair Impulse Control: Poor Appetite: Fair Weight Loss: 0 Weight Gain: 0 Sleep: No Change Total Hours of Sleep: 8 Vegetative Symptoms: None  ADLScreening [facility]) Patient's cognitive ability adequate to safely complete daily activities?: Yes Patient able to express need for  assistance with ADLs?: Yes Independently performs ADLs?: Yes (appropriate for developmental age)  Prior Inpatient Therapy Prior Inpatient Therapy: No Prior Therapy Dates: NA Prior Therapy Facilty/Provider(s): NA Reason for Treatment: NA  Prior Outpatient Therapy Prior Outpatient Therapy: Yes Prior Therapy Dates: Ongoing  Prior Therapy Facilty/Provider(s): [facility]  Reason for Treatment: Outpatient therapy and medication management  (Has not taken meds in a couple of months. )  ADL Screening (condition at time of admission) Patient's cognitive ability adequate to safely complete daily activities?: Yes Patient able to express need for assistance with ADLs?: Yes Independently performs ADLs?: Yes (appropriate for developmental age)               Nutrition Screen- [facility] Adult/[facility]/AP Patient's home diet: Regular  Additional Information 1:1 In Past 12 Months?: No CIRT Risk: No Elopement Risk: No Does patient have medical clearance?: Yes     Disposition:  Disposition Initial Assessment Completed for this Encounter: Yes Disposition of Patient: Other dispositions Other disposition(s): Other (Comment)  Craig Woodward L Elisabeth MostStevenson 08/31/2013 3:59 PM

## 2013-08-31 NOTE — ED Notes (Signed)
Melissa RN from [facility] called sts had received fax and will review intake information.

## 2013-08-31 NOTE — ED Notes (Signed)
Per pt here for alcohol detox. Denies SI/HI

## 2013-08-31 NOTE — ED Notes (Addendum)
Called [facility] to confirm placement- left message for Melissa intake nurse.

## 2013-08-31 NOTE — ED Notes (Signed)
Craig Woodward with [facility] conducting telepsych- family asked to step outside to consult room

## 2013-08-31 NOTE — ED Notes (Signed)
Tele psych machine at bedside 

## 2013-08-31 NOTE — Discharge Instructions (Signed)
Alcohol Problems °Most adults who drink alcohol drink in moderation (not a lot) are at low risk for developing problems related to their drinking. However, all drinkers, including low-risk drinkers, should know about the health risks connected with drinking alcohol. °RECOMMENDATIONS FOR LOW-RISK DRINKING  °Drink in moderation. Moderate drinking is defined as follows:  °· Men - no more than 2 drinks per day. °· Nonpregnant women - no more than 1 drink per day. °· Over age 65 - no more than 1 drink per day. °A standard drink is 12 grams of pure alcohol, which is equal to a 12 ounce bottle of beer or wine cooler, a 5 ounce glass of wine, or 1.5 ounces of distilled spirits (such as whiskey, brandy, vodka, or rum).  °ABSTAIN FROM (DO NOT DRINK) ALCOHOL: °· When pregnant or considering pregnancy. °· When taking a medication that interacts with alcohol. °· If you are alcohol dependent. °· A medical condition that prohibits drinking alcohol (such as ulcer, liver disease, or heart disease). °DISCUSS WITH YOUR CAREGIVER: °· If you are at risk for coronary heart disease, discuss the potential benefits and risks of alcohol use: Light to moderate drinking is associated with lower rates of coronary heart disease in certain populations (for example, men over age 45 and postmenopausal women). Infrequent or nondrinkers are advised not to begin light to moderate drinking to reduce the risk of coronary heart disease so as to avoid creating an alcohol-related problem. Similar protective effects can likely be gained through proper diet and exercise. °· Women and the elderly have smaller amounts of body water than men. As a result women and the elderly achieve a higher blood alcohol concentration after drinking the same amount of alcohol. °· Exposing a fetus to alcohol can cause a broad range of birth defects referred to as Fetal Alcohol Syndrome (FAS) or Alcohol-Related Birth Defects (ARBD). Although FAS/ARBD is connected with excessive  alcohol consumption during pregnancy, studies also have reported neurobehavioral problems in infants born to mothers reporting drinking an average of 1 drink per day during pregnancy. °· Heavier drinking (the consumption of more than 4 drinks per occasion by men and more than 3 drinks per occasion by women) impairs learning (cognitive) and psychomotor functions and increases the risk of alcohol-related problems, including accidents and injuries. °CAGE QUESTIONS:  °· Have you ever felt that you should Cut down on your drinking? °· Have people Annoyed you by criticizing your drinking? °· Have you ever felt bad or Guilty about your drinking? °· Have you ever had a drink first thing in the morning to steady your nerves or get rid of a hangover (Eye opener)? °If you answered positively to any of these questions: You may be at risk for alcohol-related problems if alcohol consumption is:  °· Men: Greater than 14 drinks per week or more than 4 drinks per occasion. °· Women: Greater than 7 drinks per week or more than 3 drinks per occasion. °Do you or your family have a medical history of alcohol-related problems, such as: °· Blackouts. °· Sexual dysfunction. °· Depression. °· Trauma. °· Liver dysfunction. °· Sleep disorders. °· Hypertension. °· Chronic abdominal pain. °· Has your drinking ever caused you problems, such as problems with your family, problems with your work (or school) performance, or accidents/injuries? °· Do you have a compulsion to drink or a preoccupation with drinking? °· Do you have poor control or are you unable to stop drinking once you have started? °· Do you have to drink to   avoid withdrawal symptoms? °· Do you have problems with withdrawal such as tremors, nausea, sweats, or mood disturbances? °· Does it take more alcohol than in the past to get you high? °· Do you feel a strong urge to drink? °· Do you change your plans so that you can have a drink? °· Do you ever drink in the morning to relieve  the shakes or a hangover? °If you have answered a number of the previous questions positively, it may be time for you to talk to your caregivers, family, and friends and see if they think you have a problem. Alcoholism is a chemical dependency that keeps getting worse and will eventually destroy your health and relationships. Many alcoholics end up dead, impoverished, or in prison. This is often the end result of all chemical dependency. °· Do not be discouraged if you are not ready to take action immediately. °· Decisions to change behavior often involve up and down desires to change and feeling like you cannot decide. °· Try to think more seriously about your drinking behavior. °· Think of the reasons to quit. °WHERE TO GO FOR ADDITIONAL INFORMATION  °· The National Institute on Alcohol Abuse and Alcoholism (NIAAA) °www.niaaa.nih.gov °· National Council on Alcoholism and Drug Dependence (NCADD) °www.ncadd.org °· American Society of Addiction Medicine (ASAM) °www.asam.org  °Document Released: 03/19/2005 Document Revised: 06/11/2011 Document Reviewed: 11/05/2007 °ExitCare® Patient Information ©2014 [employer]. ° °

## 2013-09-02 NOTE — ED Provider Notes (Signed)
Medical screening examination/treatment/procedure(s) were performed by non-physician practitioner and as supervising physician I was immediately available for consultation/collaboration.   Hilery L Quinlin Conant, MD 09/02/13 2041 

## 2014-07-28 ENCOUNTER — Ambulatory Visit: Admit: 2014-07-28 | Disposition: A | Discharge status: Home / Self Care | Payer: Self-pay | Admitting: Family Medicine

## 2015-11-23 ENCOUNTER — Encounter (HOSPITAL_BASED_OUTPATIENT_CLINIC_OR_DEPARTMENT_OTHER): Payer: Self-pay | Admitting: Emergency Medicine

## 2015-11-23 ENCOUNTER — Emergency Department (HOSPITAL_BASED_OUTPATIENT_CLINIC_OR_DEPARTMENT_OTHER)
Admission: EM | Admit: 2015-11-23 | Discharge: 2015-11-23 | Disposition: A | Discharge status: Home / Self Care | Payer: BLUE CROSS/BLUE SHIELD | Attending: Emergency Medicine | Admitting: Emergency Medicine

## 2015-11-23 DIAGNOSIS — M545 Low back pain: Secondary | ICD-10-CM | POA: Diagnosis present

## 2015-11-23 DIAGNOSIS — M5416 Radiculopathy, lumbar region: Secondary | ICD-10-CM | POA: Diagnosis not present

## 2015-11-23 DIAGNOSIS — J45909 Unspecified asthma, uncomplicated: Secondary | ICD-10-CM | POA: Insufficient documentation

## 2015-11-23 DIAGNOSIS — F17200 Nicotine dependence, unspecified, uncomplicated: Secondary | ICD-10-CM | POA: Diagnosis not present

## 2015-11-23 DIAGNOSIS — F909 Attention-deficit hyperactivity disorder, unspecified type: Secondary | ICD-10-CM | POA: Diagnosis not present

## 2015-11-23 HISTORY — DX: Dorsalgia, unspecified: M54.9

## 2015-11-23 MED ORDER — METHOCARBAMOL 500 MG PO TABS: 1000.0000 mg | ORAL_TABLET | Freq: Three times a day (TID) | ORAL | 0 refills | Status: DC | PRN

## 2015-11-23 MED ORDER — KETOROLAC TROMETHAMINE 60 MG/2ML IM SOLN
60.0000 mg | Freq: Once | INTRAMUSCULAR | Status: AC
  Administered 2015-11-23: 60 mg via INTRAMUSCULAR
  Filled 2015-11-23: qty 2

## 2015-11-23 MED ORDER — METHOCARBAMOL 500 MG PO TABS
1000.0000 mg | ORAL_TABLET | Freq: Once | ORAL | Status: AC
  Administered 2015-11-23: 1000 mg via ORAL
  Filled 2015-11-23: qty 2

## 2015-11-23 MED ORDER — IBUPROFEN 600 MG PO TABS: 600.0000 mg | ORAL_TABLET | Freq: Three times a day (TID) | ORAL | 0 refills | Status: DC

## 2015-11-23 NOTE — ED Triage Notes (Signed)
Lower back pain for over one month.  Pt states numbness and tingling down left leg.

## 2015-11-23 NOTE — ED Provider Notes (Signed)
[facility] Provider Note   CSN: [id number] Arrival date & time: 11/23/15  1039     History   Chief Complaint Chief Complaint  Patient presents with  . Back Pain    HPI Craig Woodward is a 35 y.o. male.  HPI Patient presents with left-sided low back pain for greater than one month. States the pain radiates into his left buttock and down his left leg. This is worse over the last 3 days. No history of recent trauma. Patient states he's had MRI in the past but cannot remember when. States he had compression of his lumbar nerves but does not remember the details of report. He is not seen a spinal Careers advisersurgeon. Denies any fever or chills. No focal weakness though he does have tingling to the left foot. No incontinence.  Past Medical History:  Diagnosis Date  . ADHD (attention deficit hyperactivity disorder) 12/31/2011  . Allergy   . Asthma    childhood  . Back pain     Patient Active Problem List   Diagnosis Date Noted  . ADHD (attention deficit hyperactivity disorder) 12/31/2011  . Anxiety 12/31/2011    Past Surgical History:  Procedure Laterality Date  . tubes in ears    . TYMPANOSTOMY TUBE PLACEMENT         Home Medications    Prior to Admission medications   Medication Sig Start Date End Date Taking? Authorizing Provider  HYDROcodone-acetaminophen (NORCO) 10-325 MG per tablet Take 1 tablet by mouth every 6 (six) hours as needed (pain).    Historical Provider, MD  ibuprofen (ADVIL,MOTRIN) 600 MG tablet Take 1 tablet (600 mg total) by mouth 3 (three) times daily after meals. 11/23/15   Loren Raceravid Timberlee Roblero, MD  methocarbamol (ROBAXIN) 500 MG tablet Take 2 tablets (1,000 mg total) by mouth every 8 (eight) hours as needed for muscle spasms. 11/23/15   Loren Raceravid Bernise Sylvain, MD  naltrexone (DEPADE) 50 MG tablet Take 50 mg by mouth daily. 07/23/13   Historical Provider, MD    Family History Family History  Problem Relation Age of Onset  . Heart disease Mother   .  Stroke Mother   . Hypertension Mother   . Diabetes Mother   . Hypertension Father   . Diabetes Father   . Hypertension Maternal Aunt   . Heart disease Maternal Grandmother   . Cancer Maternal Grandfather     lung CA    Social History Social History  Substance Use Topics  . Smoking status: Current Every Day Smoker    Packs/day: 1.00  . Smokeless tobacco: Not on file  . Alcohol use Yes     Comment: 12 to 18 beers     Allergies   Review of patient's allergies indicates no known allergies.   Review of Systems Review of Systems  Constitutional: Negative for chills and fever.  Gastrointestinal: Negative for abdominal pain, constipation, diarrhea, nausea and vomiting.  Genitourinary: Negative for difficulty urinating, flank pain and hematuria.  Musculoskeletal: Positive for back pain and myalgias. Negative for gait problem.  Skin: Negative for rash and wound.  Neurological: Positive for numbness. Negative for dizziness, weakness and headaches.  All other systems reviewed and are negative.    Physical Exam Updated Vital Signs BP (!) 165/109 (BP Location: Left Arm)   Pulse 93   Temp 97.7 F (36.5 C) (Oral)   Resp 18   Ht 6' (1.829 m)   Wt 210 lb (95.3 kg)   SpO2 100%   BMI 28.48 kg/m  Physical Exam  Constitutional: He is oriented to person, place, and time. He appears well-developed and well-nourished. No distress.  Patient is in no apparent discomfort. He appears drowsy but easily aroused  HENT:  Head: Normocephalic and atraumatic.  Mouth/Throat: Oropharynx is clear and moist.  Eyes: EOM are normal. Pupils are equal, round, and reactive to light.  Neck: Normal range of motion. Neck supple.  Cardiovascular: Normal rate and regular rhythm.   Pulmonary/Chest: Effort normal and breath sounds normal.  Abdominal: Soft. Bowel sounds are normal. There is no tenderness. There is no rebound and no guarding.  Musculoskeletal: Normal range of motion. He exhibits tenderness  (mild leftlumbosacral paraspinal tenderness.). He exhibits no edema.  No lower extremity tenderness, asymmetry or swelling. 2+ dorsalis and posterior tibial pulses. Positive straight leg raise on the left.  Neurological: He is alert and oriented to person, place, and time.  Patient with 5/5 motor in all extremities. Sensation is intact bilaterally. Ambulating without difficulty.  Skin: Skin is warm and dry. No rash noted. No erythema.  Psychiatric: He has a normal mood and affect. His behavior is normal.  Nursing note and vitals reviewed.    ED Treatments / Results  Labs (all labs ordered are listed, but only abnormal results are displayed) Labs Reviewed - No data to display  EKG  EKG Interpretation None       Radiology No results found.  Procedures Procedures (including critical care time)  Medications Ordered in ED Medications  ketorolac (TORADOL) injection 60 mg (60 mg Intramuscular Given 11/23/15 1113)  methocarbamol (ROBAXIN) tablet 1,000 mg (1,000 mg Oral Given 11/23/15 1111)     Initial Impression / Assessment and Plan / ED Course  I have reviewed the triage vital signs and the nursing notes.  Pertinent labs & imaging results that were available during my care of the patient were reviewed by me and considered in my medical decision making (see chart for details).  Clinical Course    Patient appears to be having an acute exacerbation of his chronic lumbar radiculopathy. No evidence of cauda equina syndrome. Do not believe that emergent imaging is necessary. We'll treat with NSAIDs and muscle relaxants. Advised to follow-up with a neurosurgeon for persistent symptoms. Return precautions given.  Final Clinical Impressions(s) / ED Diagnoses   Final diagnoses:  Lumbar radiculopathy    New Prescriptions New Prescriptions   IBUPROFEN (ADVIL,MOTRIN) 600 MG TABLET    Take 1 tablet (600 mg total) by mouth 3 (three) times daily after meals.   METHOCARBAMOL (ROBAXIN)  500 MG TABLET    Take 2 tablets (1,000 mg total) by mouth every 8 (eight) hours as needed for muscle spasms.     Loren Raceravid Aleksis Jiggetts, MD 11/23/15 [phone]

## 2015-11-23 NOTE — ED Notes (Signed)
Pt given d/c instructions as per chart. Verbalizes understanding. No questions. Rx x 2 with narc/tyl instructions.

## 2015-11-23 NOTE — ED Notes (Signed)
Pt c/o low back pain. Denies injury. Took Aleve PTA. Pt having problems staying awake and answering questions.

## 2016-06-26 ENCOUNTER — Emergency Department
Admission: EM | Admit: 2016-06-26 | Discharge: 2016-06-26 | Disposition: A | Discharge status: Home / Self Care | Payer: BLUE CROSS/BLUE SHIELD | Attending: Student in an Organized Health Care Education/Training Program | Admitting: Student in an Organized Health Care Education/Training Program

## 2016-06-26 DIAGNOSIS — J45909 Unspecified asthma, uncomplicated: Secondary | ICD-10-CM | POA: Insufficient documentation

## 2016-06-26 DIAGNOSIS — F909 Attention-deficit hyperactivity disorder, unspecified type: Secondary | ICD-10-CM | POA: Insufficient documentation

## 2016-06-26 DIAGNOSIS — T401X1A Poisoning by heroin, accidental (unintentional), initial encounter: Secondary | ICD-10-CM | POA: Insufficient documentation

## 2016-06-26 DIAGNOSIS — F17200 Nicotine dependence, unspecified, uncomplicated: Secondary | ICD-10-CM | POA: Insufficient documentation

## 2016-06-26 LAB — COMPREHENSIVE METABOLIC PANEL
ALT: 22 U/L (ref 17–63)
AST: 21 U/L (ref 15–41)
Albumin: 4.2 g/dL (ref 3.5–5.0)
Alkaline Phosphatase: 64 U/L (ref 38–126)
Anion gap: 5 (ref 5–15)
BILIRUBIN TOTAL: 0.5 mg/dL (ref 0.3–1.2)
BUN: 18 mg/dL (ref 6–20)
CO2: 29 mmol/L (ref 22–32)
Calcium: 8.6 mg/dL — ABNORMAL LOW (ref 8.9–10.3)
Chloride: 103 mmol/L (ref 101–111)
Creatinine, Ser: 0.8 mg/dL (ref 0.61–1.24)
GFR calc Af Amer: >60 mL/min (ref >60)
GFR calc non Af Amer: >60 mL/min (ref >60)
Glucose, Bld: 122 mg/dL — ABNORMAL HIGH (ref 65–99)
POTASSIUM: 3.4 mmol/L — AB (ref 3.5–5.1)
Sodium: 137 mmol/L (ref 135–145)
TOTAL PROTEIN: 6.8 g/dL (ref 6.5–8.1)

## 2016-06-26 LAB — CBC
HCT: 42.5 % (ref 40.0–52.0)
Hemoglobin: 14.6 g/dL (ref 13.0–18.0)
MCH: 31.9 pg (ref 26.0–34.0)
MCHC: 34.4 g/dL (ref 32.0–36.0)
MCV: 92.7 fL (ref 80.0–100.0)
Platelets: 258 10*3/uL (ref 150–440)
RBC: 4.59 MIL/uL (ref 4.40–5.90)
RDW: 13.4 % (ref 11.5–14.5)
WBC: 7.6 10*3/uL (ref 3.8–10.6)

## 2016-06-26 LAB — ETHANOL

## 2016-06-26 MED ORDER — NALOXONE HCL 2 MG/2ML IJ SOSY
0.4000 mg | PREFILLED_SYRINGE | Freq: Once | INTRAMUSCULAR | Status: AC
  Administered 2016-06-26: 0.4 mg via INTRAVENOUS
  Filled 2016-06-26: qty 2

## 2016-06-26 MED ORDER — NALOXONE HCL 4 MG/0.1ML NA LIQD: NASAL | 0 refills | Status: DC

## 2016-06-26 MED ORDER — NALOXONE HCL 2 MG/2ML IJ SOSY
0.4000 mg | PREFILLED_SYRINGE | Freq: Once | INTRAMUSCULAR | Status: DC
  Filled 2016-06-26: qty 2

## 2016-06-26 NOTE — ED Notes (Signed)
Pt ambulated around nursing stations of ED w/o issue. Pt expressed desire to leave, sts that he would follow up

## 2016-06-26 NOTE — ED Notes (Addendum)
Pts sister called regarding pick up r/t pts discharge.  Pts sister upset at pts discharge, sts that she took out IVC papers and that we'd (ED) had to keep pt.  Explained to family that while IVC papers meant that pt was required to stay in ED while active, it was not for an indefinite amount of time.  Pt denied thought of hurting self/others both to this RN and to ED provider on multiple occasions. Pt had stated that he did not OD to harm self.  Explained that ED/[facility] did not admit for detox, that pt had been given community resources in order to seek help.  This RN sympathized with pts family, but explained that while ED could provide avenues of help and assist the pt with becoming medically stable, it would be up to the pt to follow up in order to detox.       Pts family stated that she would "make a couple calls" and that "I'll see if I can pick him up"

## 2016-06-26 NOTE — ED Triage Notes (Addendum)
Pt bib EMS from home w/ c/o drug use and seeking "help". Pt lethargic, answers questions and follows commands. 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 Pt unable to say if other drug/ETOH use tonight. Pt RR will drop if not being verbally stimulated.  Pt denies SI/HI

## 2016-06-26 NOTE — ED Provider Notes (Signed)
[facility] Emergency Department Provider Note    First MD Initiated Contact with Patient 06/26/16 2036     (approximate)  I have reviewed the triage vital signs and the nursing notes.   HISTORY  Chief Complaint Drug Overdose    HPI Craig Woodward is a 36 y.o. male presents from home by EMS due to polysubstance abuse. Patient admits to snorting heroin tonight. Patient was with family and was reportedly going to detox tomorrow. Did admit to having a couple beers as well. Denies any intent for self-harm. States that he snorted "0.2 ". EMS did not give any Narcan. He arrives to the ER with shallow respirations but no hypoxia. Pupils are pinpoint but patient will respond to voice. Moving all extremities. There is no evidence of trauma.  Patient will provide history St. and he thinks that his family was trying to have an intervention with him because when he came out of the bathroom he says "there is a party waiting for me "and that's when he is brought in by EMS. Denies any discomfort at this time. He rates that he did not intend to overdose was an accident.   Past Medical History:  Diagnosis Date  . ADHD (attention deficit hyperactivity disorder) 12/31/2011  . Allergy   . Asthma    childhood  . Back pain    Family History  Problem Relation Age of Onset  . Heart disease Mother   . Stroke Mother   . Hypertension Mother   . Diabetes Mother   . Hypertension Father   . Diabetes Father   . Hypertension Maternal Aunt   . Heart disease Maternal Grandmother   . Cancer Maternal Grandfather     lung CA   Past Surgical History:  Procedure Laterality Date  . tubes in ears    . TYMPANOSTOMY TUBE PLACEMENT     Patient Active Problem List   Diagnosis Date Noted  . ADHD (attention deficit hyperactivity disorder) 12/31/2011  . Anxiety 12/31/2011      Prior to Admission medications   Medication Sig Start Date End Date Taking? Authorizing Provider    HYDROcodone-acetaminophen (NORCO) 10-325 MG per tablet Take 1 tablet by mouth every 6 (six) hours as needed (pain).    Historical Provider, MD  ibuprofen (ADVIL,MOTRIN) 600 MG tablet Take 1 tablet (600 mg total) by mouth 3 (three) times daily after meals. 11/23/15   Loren Racer, MD  methocarbamol (ROBAXIN) 500 MG tablet Take 2 tablets (1,000 mg total) by mouth every 8 (eight) hours as needed for muscle spasms. 11/23/15   Loren Racer, MD  naloxone [facility]) nasal spray 4 mg/0.1 mL Use in the event of suspected overdose 06/26/16   Willy Eddy, MD  naltrexone (DEPADE) 50 MG tablet Take 50 mg by mouth daily. 07/23/13   Historical Provider, MD    Allergies Patient has no known allergies.    Social History Social History  Substance Use Topics  . Smoking status: Current Every Day Smoker    Packs/day: 1.00  . Smokeless tobacco: Never Used  . Alcohol use Yes     Comment: 12 to 18 beers    Review of Systems Patient denies headaches, rhinorrhea, blurry vision, numbness, shortness of breath, chest pain, edema, cough, abdominal pain, nausea, vomiting, diarrhea, dysuria, fevers, rashes or hallucinations unless otherwise stated above in HPI. ____________________________________________   PHYSICAL EXAM:  VITAL SIGNS: Vitals:   06/26/16 2200 06/26/16 2219  BP: 121/77 127/87  Pulse:  94  Resp:  13 16  Temp:      Constitutional: drowsy but awakens to voice. in no acute distress. Eyes: Conjunctivae are normal. Pupils are pinpoint. EOMI. Head: Atraumatic. Nose: No congestion/rhinnorhea. Mouth/Throat: Mucous membranes are moist.  Oropharynx non-erythematous. Neck: No stridor. Painless ROM. No cervical spine tenderness to palpation Hematological/Lymphatic/Immunilogical: No cervical lymphadenopathy. Cardiovascular: Normal rate, regular rhythm. Grossly normal heart sounds.  Good peripheral circulation. Respiratory: Normal respiratory effort.  No retractions. Lungs  CTAB. Gastrointestinal: Soft and nontender. No distention. No abdominal bruits. No CVA tenderness. Musculoskeletal: No lower extremity tenderness nor edema.  No joint effusions. Neurologic:  Normal speech and language. No gross focal neurologic deficits are appreciated. No gait instability. Skin:  Skin is warm, dry and intact. No rash noted. Psychiatric: Mood and affect are normal. Speech and behavior are normal.  ____________________________________________   LABS (all labs ordered are listed, but only abnormal results are displayed)  Results for orders placed or performed during the hospital encounter of 06/26/16 (from the past 24 hour(s))  Comprehensive metabolic panel     Status: Abnormal   Collection Time: 06/26/16  8:12 PM  Result Value Ref Range   Sodium 137 135 - 145 mmol/L   Potassium 3.4 (L) 3.5 - 5.1 mmol/L   Chloride 103 101 - 111 mmol/L   CO2 29 22 - 32 mmol/L   Glucose, Bld 122 (H) 65 - 99 mg/dL   BUN 18 6 - 20 mg/dL   Creatinine, Ser 2.13 0.61 - 1.24 mg/dL   Calcium 8.6 (L) 8.9 - 10.3 mg/dL   Total Protein 6.8 6.5 - 8.1 g/dL   Albumin 4.2 3.5 - 5.0 g/dL   AST 21 15 - 41 U/L   ALT 22 17 - 63 U/L   Alkaline Phosphatase 64 38 - 126 U/L   Total Bilirubin 0.5 0.3 - 1.2 mg/dL   GFR calc non Af Amer >60 >60 mL/min   GFR calc Af Amer >60 >60 mL/min   Anion gap 5 5 - 15  Ethanol     Status: None   Collection Time: 06/26/16  8:12 PM  Result Value Ref Range   Alcohol, Ethyl (B) <5 <5 mg/dL  cbc     Status: None   Collection Time: 06/26/16  8:12 PM  Result Value Ref Range   WBC 7.6 3.8 - 10.6 K/uL   RBC 4.59 4.40 - 5.90 MIL/uL   Hemoglobin 14.6 13.0 - 18.0 g/dL   HCT 08.6 57.8 - 46.9 %   MCV 92.7 80.0 - 100.0 fL   MCH 31.9 26.0 - 34.0 pg   MCHC 34.4 32.0 - 36.0 g/dL   RDW 62.9 52.8 - 41.3 %   Platelets 258 150 - 440 K/uL    ____________________________________________  EKG____________________________________________  RADIOLOGY   ____________________________________________   PROCEDURES  Procedure(s) performed:  Procedures    Critical Care performed: no ____________________________________________   INITIAL IMPRESSION / ASSESSMENT AND PLAN / ED COURSE  Pertinent labs & imaging results that were available during my care of the patient were reviewed by me and considered in my medical decision making (see chart for details).  DDX: overdose, accidental, interntional, electroyle abn,  Iyan Flett is a 36 y.o. who presents to the ED with evidence of narcotic overdose.  Denies any SI or HI. Denies any intentional overdose. Patient without any evidence of trauma. Do not feel CT imaging clinically indicated in this presentation.  Maryclare Labrador give trial of Narcan to confirm diagnosis.  Clinical Course as of Mar  16 [id number]  Tue Jun 26, 2016  2110 Patient given narcan with significant improvement in symptoms.  Will continue to monitor.  [PR]  2148 Glucose: (!) 122 [PR]  2218 Patient able to ambulate about the ER in no acute distress. Tolerating oral hydration.  Blood is reassuring.  On reevaluation patient denies any SI or HI. Patient will be provided prescription for Narcan as well as follow-up with [facility] for substance abuse assistance.  [PR]    Clinical Course User Index [PR] Willy EddyPatrick Hawley Pavia, MD     ____________________________________________   FINAL CLINICAL IMPRESSION(S) / ED DIAGNOSES  Final diagnoses:  Accidental overdose of heroin, initial encounter      NEW MEDICATIONS STARTED DURING THIS VISIT:  New Prescriptions   NALOXONE (NARCAN) NASAL SPRAY 4 MG/0.1 ML    Use in the event of suspected overdose     Note:  This document was prepared using Dragon voice recognition software and may include unintentional dictation errors.    Willy EddyPatrick Briani Maul, MD 06/26/16 [phone]

## 2016-06-26 NOTE — ED Notes (Signed)
Informed MD Roxan Hockeyobinson that pts sister (who called EMS) took out IVC papers

## 2016-06-27 NOTE — ED Notes (Signed)
Pt and pt mother came to [facility] for listing of residential treatment centers for Detox that was not provided at [facility]. Listing provided to pt and pt mother stated she was talking with inpatient centers for further care.

## 2017-01-20 ENCOUNTER — Emergency Department: Payer: Self-pay

## 2017-01-20 ENCOUNTER — Encounter: Payer: Self-pay | Admitting: Emergency Medicine

## 2017-01-20 ENCOUNTER — Emergency Department
Admission: EM | Admit: 2017-01-20 | Discharge: 2017-01-20 | Disposition: A | Discharge status: Home / Self Care | Payer: Self-pay | Attending: Emergency Medicine | Admitting: Emergency Medicine

## 2017-01-20 DIAGNOSIS — J209 Acute bronchitis, unspecified: Secondary | ICD-10-CM | POA: Insufficient documentation

## 2017-01-20 DIAGNOSIS — R0789 Other chest pain: Secondary | ICD-10-CM | POA: Insufficient documentation

## 2017-01-20 DIAGNOSIS — R079 Chest pain, unspecified: Secondary | ICD-10-CM

## 2017-01-20 DIAGNOSIS — J45909 Unspecified asthma, uncomplicated: Secondary | ICD-10-CM | POA: Insufficient documentation

## 2017-01-20 DIAGNOSIS — R0781 Pleurodynia: Secondary | ICD-10-CM

## 2017-01-20 DIAGNOSIS — F17210 Nicotine dependence, cigarettes, uncomplicated: Secondary | ICD-10-CM | POA: Insufficient documentation

## 2017-01-20 LAB — CBC
HCT: 44.2 % (ref 40.0–52.0)
Hemoglobin: 15 g/dL (ref 13.0–18.0)
MCH: 30.6 pg (ref 26.0–34.0)
MCHC: 33.8 g/dL (ref 32.0–36.0)
MCV: 90.6 fL (ref 80.0–100.0)
PLATELETS: 308 10*3/uL (ref 150–440)
RBC: 4.88 MIL/uL (ref 4.40–5.90)
RDW: 13.6 % (ref 11.5–14.5)
WBC: 7.8 10*3/uL (ref 3.8–10.6)

## 2017-01-20 LAB — BASIC METABOLIC PANEL
Anion gap: 3 — ABNORMAL LOW (ref 5–15)
BUN: 18 mg/dL (ref 6–20)
CHLORIDE: 101 mmol/L (ref 101–111)
CO2: 26 mmol/L (ref 22–32)
Calcium: 9.1 mg/dL (ref 8.9–10.3)
Creatinine, Ser: 0.85 mg/dL (ref 0.61–1.24)
GFR calc non Af Amer: >60 mL/min (ref >60)
Glucose, Bld: 110 mg/dL — ABNORMAL HIGH (ref 65–99)
Potassium: 4.3 mmol/L (ref 3.5–5.1)
Sodium: 130 mmol/L — ABNORMAL LOW (ref 135–145)

## 2017-01-20 LAB — TROPONIN I
Troponin I: 0.04 ng/mL (ref <0.03)
Troponin I: 0.04 ng/mL (ref <0.03)

## 2017-01-20 MED ORDER — ALBUTEROL SULFATE (2.5 MG/3ML) 0.083% IN NEBU: 2.5000 mg | INHALATION_SOLUTION | Freq: Four times a day (QID) | RESPIRATORY_TRACT | 1 refills | Status: DC | PRN

## 2017-01-20 MED ORDER — ASPIRIN 81 MG PO CHEW
324.0000 mg | CHEWABLE_TABLET | Freq: Once | ORAL | Status: AC
  Administered 2017-01-20: 324 mg via ORAL
  Filled 2017-01-20: qty 4

## 2017-01-20 MED ORDER — IPRATROPIUM-ALBUTEROL 0.5-2.5 (3) MG/3ML IN SOLN
3.0000 mL | Freq: Once | RESPIRATORY_TRACT | Status: AC
  Administered 2017-01-20: 3 mL via RESPIRATORY_TRACT
  Filled 2017-01-20: qty 3

## 2017-01-20 MED ORDER — IOPAMIDOL (ISOVUE-370) INJECTION 76%
75.0000 mL | Freq: Once | INTRAVENOUS | Status: AC | PRN
  Administered 2017-01-20: 75 mL via INTRAVENOUS

## 2017-01-20 MED ORDER — PREDNISONE 20 MG PO TABS
40.0000 mg | ORAL_TABLET | Freq: Once | ORAL | Status: AC
  Administered 2017-01-20: 40 mg via ORAL
  Filled 2017-01-20: qty 2

## 2017-01-20 MED ORDER — IBUPROFEN 800 MG PO TABS
800.0000 mg | ORAL_TABLET | ORAL | Status: AC
  Administered 2017-01-20: 800 mg via ORAL
  Filled 2017-01-20: qty 1

## 2017-01-20 MED ORDER — PREDNISONE 20 MG PO TABS: 40.0000 mg | ORAL_TABLET | Freq: Every day | ORAL | 0 refills | Status: DC

## 2017-01-20 MED ORDER — AZITHROMYCIN 500 MG PO TABS
500.0000 mg | ORAL_TABLET | Freq: Once | ORAL | Status: AC
  Administered 2017-01-20: 500 mg via ORAL
  Filled 2017-01-20: qty 1

## 2017-01-20 NOTE — ED Provider Notes (Signed)
[facility] Emergency Department Provider Note   ____________________________________________   First MD Initiated Contact with Patient 01/20/17 1704     (approximate)  I have reviewed the triage vital signs and the nursing notes.   HISTORY  Chief Complaint Chest Pain    HPI Craig Woodward is a 36 y.o. male here for evaluation of chest pain  Patient reports she was walking out of a store when he began experiencing sudden pain on the right side of his chest.  Reports the pain is worse when he takes a deep breath.  Radiates slightly towards his back.  He was diagnosed about a week ago with "bronchitis" but not been able to fill prescriptions for inhaler, prednisone, or azithromycin yet though he was planning to do that soon but he had lost the prescriptions.  Source like feeling shortness of breath is reports the pain over the right ribs takes his breath when he takes a deep breath.  No nausea vomiting.  No fevers or chills.  Does have a cough, but reports that slightly better but has been coughing a fair amount for about the last week.  He is a smoker.  Has a distant history of cocaine use and reports he used to drink a lot of beer, but now only occasionally has beer is cut back a lot  Past Medical History:  Diagnosis Date  . ADHD (attention deficit hyperactivity disorder) 12/31/2011  . Allergy   . Asthma    childhood  . Back pain     Patient Active Problem List   Diagnosis Date Noted  . ADHD (attention deficit hyperactivity disorder) 12/31/2011  . Anxiety 12/31/2011    Past Surgical History:  Procedure Laterality Date  . tubes in ears    . TYMPANOSTOMY TUBE PLACEMENT      Medications: Albuterol nebulizer at home Reports otherwise not taking any medication  Allergies Patient has no known allergies.  Family History  Problem Relation Age of Onset  . Heart disease Mother   . Stroke Mother   . Hypertension Mother   . Diabetes  Mother   . Hypertension Father   . Diabetes Father   . Hypertension Maternal Aunt   . Heart disease Maternal Grandmother   . Cancer Maternal Grandfather        lung CA    Social History Social History  Substance Use Topics  . Smoking status: Current Every Day Smoker    Packs/day: 1.00  . Smokeless tobacco: Never Used  . Alcohol use Yes     Comment: 12 to 18 beers    Review of Systems Constitutional: No fever/chills did feel ill about a week ago Eyes: No visual changes. ENT: No sore throat. Cardiovascular: Denies chest pain. Respiratory: Denies shortness of breath. Gastrointestinal: No abdominal pain.  No nausea, no vomiting.  No diarrhea.  No constipation. Genitourinary: Negative for dysuria. Musculoskeletal: Negative for back pain. Skin: Negative for rash. Neurological: Negative for headaches, focal weakness or numbness.    ____________________________________________   PHYSICAL EXAM:  VITAL SIGNS: ED Triage Vitals [01/20/17 1415]  Enc Vitals Group     BP (!) 139/98     Pulse Rate 93     Resp 20     Temp 97.9 F (36.6 C)     Temp Source Oral     SpO2 94 %     Weight 205 lb (93 kg)     Height 6' (1.829 m)     Head Circumference  Peak Flow      Pain Score 8     Pain Loc      Pain Edu?      Excl. in GC?     Constitutional: Alert and oriented. Well appearing and in no acute distress. Eyes: Conjunctivae are normal. Head: Atraumatic. Nose: No congestion/rhinnorhea.  Mouth/Throat: Mucous membranes are moist. Neck: No stridor.   Cardiovascular: Normal rate, regular rhythm. Grossly normal heart sounds.  Good peripheral circulation.  Reports pain worsens with taking a deep breath, locates over the right middle rib cage.  Pain does not worsen with sitting back, not relieved by sitting forward or sitting up. Respiratory: Normal respiratory effort.  No retractions. Lungs CTAB. Gastrointestinal: Soft and nontender. No distention. Musculoskeletal: No lower  extremity tenderness nor edema. Neurologic:  Normal speech and language. No gross focal neurologic deficits are appreciated.  Skin:  Skin is warm, dry and intact. No rash noted. Psychiatric: Mood and affect are normal. Speech and behavior are normal.  ____________________________________________   LABS (all labs ordered are listed, but only abnormal results are displayed)  Labs Reviewed  BASIC METABOLIC PANEL - Abnormal; Notable for the following:       Result Value   Sodium 130 (*)    Glucose, Bld 110 (*)    Anion gap 3 (*)    All other components within normal limits  TROPONIN I - Abnormal; Notable for the following:    Troponin I 0.04 (*)    All other components within normal limits  TROPONIN I - Abnormal; Notable for the following:    Troponin I 0.04 (*)    All other components within normal limits  CBC   ____________________________________________  EKG ED ECG REPORT I, Paytience Bures, the attending physician, personally viewed and interpreted this ECG.  Date: 01/20/2017 EKG Time: 1410 Rate: 90 Rhythm: normal sinus rhythm QRS Axis: normal Intervals: normal ST/T Wave abnormalities: normal Narrative Interpretation: no evidence of acute ischemia   ____________________________________________  RADIOLOGY  Dg Chest 2 View  Result Date: 01/20/2017 CLINICAL DATA:  Shortness of breath.  Chest pain. EXAM: CHEST  2 VIEW COMPARISON:  None. FINDINGS: The heart size and mediastinal contours are within normal limits. Both lungs are clear. The visualized skeletal structures are unremarkable. IMPRESSION: No active cardiopulmonary disease. Electronically Signed   By: Gerome Sam III M.D   On: 01/20/2017 14:58   Ct Angio Chest Pe W Or Wo Contrast  Result Date: 01/20/2017 CLINICAL DATA:  Chest and arm pain. Elevated troponin. History of asthma and bronchitis. Assess for pulmonary embolism. EXAM: CT ANGIOGRAPHY CHEST WITH CONTRAST TECHNIQUE: Multidetector CT imaging of the chest  was performed using the standard protocol during bolus administration of intravenous contrast. Multiplanar CT image reconstructions and MIPs were obtained to evaluate the vascular anatomy. CONTRAST:  75 cc Isovue 370 COMPARISON:  Chest radiograph January 20, 2017 FINDINGS: CARDIOVASCULAR: Suboptimal contrast opacification of the pulmonary artery's (205 Hounsfield units, target is 250 Hounsfield units). Main pulmonary artery is not enlarged. No pulmonary arterial filling defects to the level of the subsegmental branches. Heart size is normal, no right heart strain. No pericardial effusion. Thoracic aorta is normal course and caliber, unremarkable. MEDIASTINUM/NODES: No lymphadenopathy by CT size criteria. Scattered subcentimeter mediastinal and RIGHT greater than LEFT hilar lymph nodes are likely reactive. LUNGS/PLEURA: Bronchial wall thickening with mucous plugging in lung bases bilaterally. No pneumothorax. No pleural effusions, focal consolidations, pulmonary nodules or masses. UPPER ABDOMEN: Included view of the abdomen is unremarkable. MUSCULOSKELETAL: Visualized soft  tissues and included osseous structures appear normal. Review of the MIP images confirms the above findings. IMPRESSION: 1. No acute pulmonary embolism. 2. Bronchitis with bibasilar mucoid impaction.  No pneumonia. Electronically Signed   By: Awilda Metro M.D.   On: 01/20/2017 19:24  CT and chest x-ray reviewed, some mucoid impaction noted on CT but no pulmonary embolism.  ____________________________________________   PROCEDURES  Procedure(s) performed: None  Procedures  Critical Care performed: No  ____________________________________________   INITIAL IMPRESSION / ASSESSMENT AND PLAN / ED COURSE  Pertinent labs & imaging results that were available during my care of the patient were reviewed by me and considered in my medical decision making (see chart for details).  Patient presents for evaluation of chest pain.   Symptomatology very atypical of acute coronary syndrome, he reports recent bronchitis with cough and then began experiencing right-sided pleuritic type pain today.  His EKG is normal and reassuring, he does have cardiac risk factors including smoking and a previous history of cocaine use but no known coronary history.  Denies history of elevated blood pressure or cholesterol.  Clinical Course as of Jan 21 58  Wynelle Link Jan 20, 2017  1958 Patient reports he is feeling improved, he is resting comfortably.  Reviewed his results thus far with him including CT.  [MQ]    Clinical Course User Index [MQ] Sharyn Creamer, MD   Differential diagnosis includes, but is not limited to, ACS, aortic dissection, pulmonary embolism, cardiac tamponade, pneumothorax, pneumonia, pericarditis/myocarditis, GI-related causes including esophagitis/gastritis, and musculoskeletal chest wall pain.    Based on his symptomatology and recent viral bronchitis, I suspect this may represent mild pericarditis feel less likely myocarditis, and cannot exclude acute coronary syndrome but his symptoms are extremely atypical.  ----------------------------------------- 8:44 PM on 01/20/2017 -----------------------------------------  Discussed case and care with cardiology on-call for [facility].  Discussed we plan to observe him for further evaluation of his chest pain is to be excluded and further delineate cause, quite possibly pericarditis or less likely myocarditis.  Discussed with the patient recommendation to be admitted for further evaluation and to make sure he is not having her at risk for having a heart attack or major heart related complication given his symptoms but the patient reports that he is not going to stay and wants to be able to go home.  Reports he does not want stay in the hospital, if he reports he feels like he is fine now and his pain is better and he wishes to go home.  01/20/2017 at 8:45 PM:  The patient requested to  leave.  I considered this to be leaving against medical advice. I personally discussed the following with them:  1)  That they currently had a medical condition of chest pain and I am concerned that they may have a heart problem such as be at risk for having a heart attack, or possible infection like a virus infecting the heart which could be life-threatening if not further evaluated.  2)  My proposed course of evaluation and treatment includes, but is not limited to, admission for further workup and blood work as well as possibly a MRI or stress test and a consultation with her cardiologist.  Benefits of staying include possible diagnosis or excluding of a heart attack or heart related problem or an alternative serious condition such as infection in or around the heart, which if identified early would lead to appropriate intervention in a timely manner lessening the burden of disability and death.  3) Risks of leaving before this had been completed include: misdiagnosis, worsening illness leading up to and including prolonged or permanent disability or death.  Specific risks pertinent, but not all inclusive, of their current medical condition include but are not limited to death, worsening of his condition, heart attack, major catastrophic heart problem or severe infection.  I also discussed alternatives including waiting for further blood work and testing in the ER.  Despite this they stated they wanted to leave due to not wishing to stay in the hospital and wanting to be able to go home and refused further evaluation, treatment, or admission at this time.   They appeared clinically sober, were mentating appropriately, were free from distracting injury, had adequately controlled acute pain, appeared to have intact insight, judgment, and reason, and in my opinion had the capacity to make this decision.  Specifically, they were able to verbally state back in a coherent manner their current medical  condition/current diagnosis, the proposed course of evaluation and/or treatment, and the risks, benefits, and alternatives of treatment versus leaving against medical advice.   They understand that they may return to seek medical attention here at ANY time they want.  I strongly advised them to return to the Emergency Department immediately if they experience any new or worsening symptoms that concern them, or simply if they reconsider continued evaluation and/or treatment as previously discussed.  This would be without any repercussions, though they understand they likely will need to wait again in the Emergency Department if other patients are in front of them, rather than being brought straight back.  They understood this is another advantage of staying, but still insisted upon leaving.  I recommended they follow-up with Dr. Dossie Arbourim Gollan at the earliest available opportunity/appointment for further evaluation and treatment.   The patient was discharged against medical advice.  They did accept written discharge instructions.    ____________________________________________   FINAL CLINICAL IMPRESSION(S) / ED DIAGNOSES  Final diagnoses:  Chest pain, moderate coronary artery risk  Pleuritic chest pain  Acute bronchitis, unspecified organism      NEW MEDICATIONS STARTED DURING THIS VISIT:  Discharge Medication List as of 01/20/2017  8:40 PM    START taking these medications   Details  albuterol (PROVENTIL) (2.5 MG/3ML) 0.083% nebulizer solution Take 3 mLs (2.5 mg total) by nebulization every 6 (six) hours as needed for wheezing or shortness of breath., Starting Sun 01/20/2017, Print    predniSONE (DELTASONE) 20 MG tablet Take 2 tablets (40 mg total) by mouth daily., Starting Sun 01/20/2017, Print         Note:  This document was prepared using Dragon voice recognition software and may include unintentional dictation errors.     [name], Shere Eisenhart, MD 01/21/17 0100

## 2017-01-20 NOTE — ED Triage Notes (Signed)
Patient presents to the ED with sudden right sided chest pain radiating down his right arm that is worse when he breathes.  Patient holding his chest in triage and tearful.  Patient reports he was recently diagnosed with bronchitis and has a history of asthma but patient states he has never felt pain like this.  Patient reports past history of cocaine use but denies any recent use.  Patient denies nausea and diaphoresis.

## 2017-01-23 ENCOUNTER — Telehealth: Payer: Self-pay

## 2017-01-23 NOTE — Telephone Encounter (Signed)
Pt was seen in ED on 01/20/17  They have not been seen in office before  Will try again at a later time

## 2017-05-30 ENCOUNTER — Emergency Department

## 2017-05-30 ENCOUNTER — Other Ambulatory Visit: Payer: Self-pay

## 2017-05-30 ENCOUNTER — Encounter: Payer: Self-pay | Admitting: Emergency Medicine

## 2017-05-30 ENCOUNTER — Emergency Department
Admission: EM | Admit: 2017-05-30 | Discharge: 2017-05-30 | Disposition: A | Discharge status: Home / Self Care | Attending: Emergency Medicine | Admitting: Emergency Medicine

## 2017-05-30 DIAGNOSIS — J209 Acute bronchitis, unspecified: Secondary | ICD-10-CM

## 2017-05-30 DIAGNOSIS — Z79899 Other long term (current) drug therapy: Secondary | ICD-10-CM | POA: Insufficient documentation

## 2017-05-30 DIAGNOSIS — R05 Cough: Secondary | ICD-10-CM | POA: Diagnosis present

## 2017-05-30 DIAGNOSIS — F17200 Nicotine dependence, unspecified, uncomplicated: Secondary | ICD-10-CM | POA: Diagnosis not present

## 2017-05-30 MED ORDER — IPRATROPIUM-ALBUTEROL 0.5-2.5 (3) MG/3ML IN SOLN
3.0000 mL | Freq: Once | RESPIRATORY_TRACT | Status: AC
  Administered 2017-05-30: 3 mL via RESPIRATORY_TRACT
  Filled 2017-05-30: qty 3

## 2017-05-30 MED ORDER — AZITHROMYCIN 500 MG PO TABS
500.0000 mg | ORAL_TABLET | Freq: Once | ORAL | Status: AC
  Administered 2017-05-30: 500 mg via ORAL
  Filled 2017-05-30: qty 1

## 2017-05-30 MED ORDER — BENZONATATE 100 MG PO CAPS: 100.0000 mg | ORAL_CAPSULE | Freq: Three times a day (TID) | ORAL | 0 refills | Status: AC | PRN

## 2017-05-30 MED ORDER — AZITHROMYCIN 500 MG PO TABS: 500.0000 mg | ORAL_TABLET | Freq: Every day | ORAL | 0 refills | Status: AC

## 2017-05-30 MED ORDER — ALBUTEROL SULFATE HFA 108 (90 BASE) MCG/ACT IN AERS: 2.0000 | INHALATION_SPRAY | Freq: Four times a day (QID) | RESPIRATORY_TRACT | 0 refills | Status: DC | PRN

## 2017-05-30 MED ORDER — BENZONATATE 100 MG PO CAPS
100.0000 mg | ORAL_CAPSULE | Freq: Once | ORAL | Status: AC
  Administered 2017-05-30: 100 mg via ORAL
  Filled 2017-05-30: qty 1

## 2017-05-30 NOTE — ED Triage Notes (Signed)
Pt to triage via w/c with no distress noted, in [place] deputy from [place]; pt reports some difficulty breathing x 3wks with some productive cough of brown sputum; hx asthma and normally uses neb treatment

## 2017-05-30 NOTE — ED Notes (Signed)
Pt. Verbalizes understanding of d/c instructions, medications, and follow-up. VS stable and pain controlled per pt.  Pt. In NAD at time of d/c and denies further concerns regarding this visit. Pt. Stable at the time of departure from the unit In police custody.  Pt advised to return to the ED at any time for emergent concerns, or for new/worsening symptoms.

## 2017-05-30 NOTE — ED Notes (Signed)
Pt woken up and states that he feels better "like it's breaking it up"

## 2017-05-30 NOTE — ED Provider Notes (Signed)
[facility] Emergency Department Provider Note _   First MD Initiated Contact with Patient 05/30/17 [phone]     (approximate)  I have reviewed the triage vital signs and the nursing notes.   HISTORY  Chief Complaint Cough    HPI Craig Woodward is a 37 y.o. male presents to the emergency department in custody of [name] deputy from the jail with 3-week history of productive cough of brown sputum.  Patient states that he has a history of asthma and normally requires nebulizer treatments.  Patient also admits to daily cigarette use.  Patient denies any fever.   Past Medical History:  Diagnosis Date  . ADHD (attention deficit hyperactivity disorder) 12/31/2011  . Allergy   . Asthma    childhood  . Back pain     Patient Active Problem List   Diagnosis Date Noted  . ADHD (attention deficit hyperactivity disorder) 12/31/2011  . Anxiety 12/31/2011    Past Surgical History:  Procedure Laterality Date  . tubes in ears    . TYMPANOSTOMY TUBE PLACEMENT      Prior to Admission medications   Medication Sig Start Date End Date Taking? Authorizing Provider  albuterol (PROVENTIL HFA;VENTOLIN HFA) 108 (90 Base) MCG/ACT inhaler Inhale 2 puffs into the lungs every 6 (six) hours as needed for wheezing or shortness of breath. 05/30/17   [name], [place] N, MD  albuterol (PROVENTIL) (2.5 MG/3ML) 0.083% nebulizer solution Take 3 mLs (2.5 mg total) by nebulization every 6 (six) hours as needed for wheezing or shortness of breath. 01/20/17   [name], Mark, MD  azithromycin (ZITHROMAX) 500 MG tablet Take 1 tablet (500 mg total) by mouth daily for 3 days. Take 1 tablet daily for 3 days. 05/30/17 06/02/17  [name], [place] N, MD  benzonatate (TESSALON PERLES) 100 MG capsule Take 1 capsule (100 mg total) by mouth 3 (three) times daily as needed for up to 10 days for cough. 05/30/17 06/09/17  [name], [place] N, MD  HYDROcodone-acetaminophen (NORCO) 10-325 MG per tablet Take  1 tablet by mouth every 6 (six) hours as needed (pain).    [provider]  ibuprofen (ADVIL,MOTRIN) 600 MG tablet Take 1 tablet (600 mg total) by mouth 3 (three) times daily after meals. 11/23/15   Loren RacerYelverton, David, MD  methocarbamol (ROBAXIN) 500 MG tablet Take 2 tablets (1,000 mg total) by mouth every 8 (eight) hours as needed for muscle spasms. 11/23/15   Loren RacerYelverton, David, MD  naloxone [facility](NARCAN) nasal spray 4 mg/0.1 mL Use in the event of suspected overdose 06/26/16   Willy Eddyobinson, Patrick, MD  naltrexone (DEPADE) 50 MG tablet Take 50 mg by mouth daily. 07/23/13   [provider]  predniSONE (DELTASONE) 20 MG tablet Take 2 tablets (40 mg total) by mouth daily. 01/20/17   [name], Mark, MD    Allergies No known drug allergies  Family History  Problem Relation Age of Onset  . Heart disease Mother   . Stroke Mother   . Hypertension Mother   . Diabetes Mother   . Hypertension Father   . Diabetes Father   . Hypertension Maternal Aunt   . Heart disease Maternal Grandmother   . Cancer Maternal Grandfather        lung CA    Social History Social History   Tobacco Use  . Smoking status: Current Every Day Smoker    Packs/day: 1.00  . Smokeless tobacco: Never Used  Substance Use Topics  . Alcohol use: Yes    Comment:  12 to 18 beers  . Drug use: Yes    Types: Cocaine    Comment: "in the past"    Review of Systems Constitutional: No fever/chills Eyes: No visual changes. ENT: No sore throat.  Positive for nasal congestion Cardiovascular: Denies chest pain. Respiratory: Denies shortness of breath.  Positive for cough Gastrointestinal: No abdominal pain.  No nausea, no vomiting.  No diarrhea.  No constipation. Genitourinary: Negative for dysuria. Musculoskeletal: Negative for neck pain.  Negative for back pain. Integumentary: Negative for rash. Neurological: Negative for headaches, focal weakness or numbness.   ____________________________________________   PHYSICAL  EXAM:  VITAL SIGNS: ED Triage Vitals  Enc Vitals Group     BP 05/30/17 0225 (!) 115/101     Pulse Rate 05/30/17 0225 92     Resp 05/30/17 0225 18     Temp 05/30/17 0225 98.1 F (36.7 C)     Temp Source 05/30/17 0225 Oral     SpO2 05/30/17 0225 98 %     Weight 05/30/17 0223 90.7 kg (200 lb)     Height 05/30/17 0223 1.854 m (6\' 1" )     Head Circumference --      Peak Flow --      Pain Score --      Pain Loc --      Pain Edu? --      Excl. in GC? --     Constitutional: Alert and oriented. Well appearing and in no acute distress. Eyes: Conjunctivae are normal.  Head: Atraumatic. Nose: No congestion/rhinnorhea. Mouth/Throat: Mucous membranes are moist. Oropharynx non-erythematous. Neck: No stridor.   Cardiovascular: Normal rate, regular rhythm. Good peripheral circulation. Grossly normal heart sounds. Respiratory: Normal respiratory effort.  No retractions.  Mild expiratory wheeze Gastrointestinal: Soft and nontender. No distention.  Musculoskeletal: No lower extremity tenderness nor edema. No gross deformities of extremities. Neurologic:  Normal speech and language. No gross focal neurologic deficits are appreciated.  Skin:  Skin is warm, dry and intact. No rash noted. Psychiatric: Mood and affect are normal. Speech and behavior are normal. ______________  RADIOLOGY I, [place] N BROWN, personally viewed and evaluated these images (plain radiographs) as part of my medical decision making, as well as reviewing the written report by the radiologist.  ED MD interpretation: No acute cardiopulmonary findings per radiologist  Official radiology report(s): Dg Chest 2 View  Result Date: 05/30/2017 CLINICAL DATA:  37 year old male with cough. EXAM: CHEST  2 VIEW COMPARISON:  Chest CT dated 01/20/2017 FINDINGS: There is hyperinflation of the lungs which may be related to underlying reactive small airway disease or asthma. No focal consolidation, pleural effusion, or pneumothorax. The  cardiac silhouette is within normal limits. No acute osseous pathology. IMPRESSION: Hyperinflation.  No focal consolidation. Electronically Signed   By: Elgie Collard M.D.   On: 05/30/2017 03:00      Procedures   ____________________________________________   INITIAL IMPRESSION / ASSESSMENT AND PLAN / ED COURSE  As part of my medical decision making, I reviewed the following data within the electronic MEDICAL RECORD NUMBER   37 year old male presenting the emergency department above-stated history and physical exam secondary to productive cough consider to possibly of pneumonia and as such x-ray was performed which revealed no focal consolidation.  Possibility of bronchitis and as such patient and as such patient given albuterol nebulized treatment azithromycin and Tessalon Perles will be prescribed albuterol inhaler azithromycin and Tessalon for home     ____________________________________________  FINAL CLINICAL IMPRESSION(S) / ED DIAGNOSES  Final diagnoses:  Acute bronchitis, unspecified organism     MEDICATIONS GIVEN DURING THIS VISIT:  Medications  benzonatate (TESSALON) capsule 100 mg (100 mg Oral Given 05/30/17 0307)  ipratropium-albuterol (DUONEB) 0.5-2.5 (3) MG/3ML nebulizer solution 3 mL (3 mLs Nebulization Given 05/30/17 0307)  azithromycin (ZITHROMAX) tablet 500 mg (500 mg Oral Given 05/30/17 0307)     ED Discharge Orders        Ordered    azithromycin (ZITHROMAX) 500 MG tablet  Daily     05/30/17 0304    benzonatate (TESSALON PERLES) 100 MG capsule  3 times daily PRN     05/30/17 0304    albuterol (PROVENTIL HFA;VENTOLIN HFA) 108 (90 Base) MCG/ACT inhaler  Every 6 hours PRN     05/30/17 0304       Note:  This document was prepared using Dragon voice recognition software and may include unintentional dictation errors.    Darci Current, MD 05/30/17 [phone]

## 2017-05-30 NOTE — ED Notes (Signed)
ED Provider at bedside. 

## 2017-07-12 ENCOUNTER — Other Ambulatory Visit: Payer: Self-pay

## 2017-07-12 ENCOUNTER — Emergency Department (HOSPITAL_COMMUNITY)
Admission: EM | Admit: 2017-07-12 | Discharge: 2017-07-12 | Disposition: A | Discharge status: Home / Self Care | Attending: Emergency Medicine | Admitting: Emergency Medicine

## 2017-07-12 ENCOUNTER — Encounter (HOSPITAL_COMMUNITY): Payer: Self-pay | Admitting: Emergency Medicine

## 2017-07-12 DIAGNOSIS — F17210 Nicotine dependence, cigarettes, uncomplicated: Secondary | ICD-10-CM | POA: Insufficient documentation

## 2017-07-12 DIAGNOSIS — L739 Follicular disorder, unspecified: Secondary | ICD-10-CM

## 2017-07-12 DIAGNOSIS — J45909 Unspecified asthma, uncomplicated: Secondary | ICD-10-CM | POA: Insufficient documentation

## 2017-07-12 DIAGNOSIS — Z79899 Other long term (current) drug therapy: Secondary | ICD-10-CM | POA: Insufficient documentation

## 2017-07-12 MED ORDER — AMOXICILLIN 500 MG PO CAPS: 500.0000 mg | ORAL_CAPSULE | Freq: Three times a day (TID) | ORAL | 0 refills | Status: DC

## 2017-07-12 MED ORDER — ERYTHROMYCIN 2 % EX GEL: Freq: Every day | CUTANEOUS | 0 refills | Status: DC

## 2017-07-12 NOTE — ED Triage Notes (Signed)
Pt c/o of right sided dental pain starting 3 days ago. Swelling noted to area.

## 2017-07-12 NOTE — Discharge Instructions (Addendum)
Your examination favors folliculitis.  Please apply erythromycin gel to both sides of your face 2 times daily.  Please use Amoxil 3 times daily with food.  Please be careful and shaving, not to shave to close and to avoid nicking the skin.

## 2017-07-12 NOTE — ED Provider Notes (Signed)
[facility] EMERGENCY DEPARTMENT Provider Note   CSN: [id number] Arrival date & time: 07/12/17  1355     History   Chief Complaint Chief Complaint  Patient presents with  . Dental Pain    HPI Craig Woodward is a 37 y.o. male.  Patient complains of pain and swelling of the right face.  This is getting worse over the past 3 days.  No fever reported.  No vomiting reported.  The history is provided by the patient.  Dental Pain   This is a new problem. The current episode started more than 2 days ago. The problem occurs hourly. The problem has been gradually worsening. The pain is moderate. He has tried acetaminophen (took a few of his sisters antibiotics.) for the symptoms. The treatment provided no relief.    Past Medical History:  Diagnosis Date  . ADHD (attention deficit hyperactivity disorder) 12/31/2011  . Allergy   . Asthma    childhood  . Back pain     Patient Active Problem List   Diagnosis Date Noted  . ADHD (attention deficit hyperactivity disorder) 12/31/2011  . Anxiety 12/31/2011    Past Surgical History:  Procedure Laterality Date  . tubes in ears    . TYMPANOSTOMY TUBE PLACEMENT          Home Medications    Prior to Admission medications   Medication Sig Start Date End Date Taking? Authorizing Provider  albuterol (PROVENTIL HFA;VENTOLIN HFA) 108 (90 Base) MCG/ACT inhaler Inhale 2 puffs into the lungs every 6 (six) hours as needed for wheezing or shortness of breath. 05/30/17   [name], [place] N, MD  albuterol (PROVENTIL) (2.5 MG/3ML) 0.083% nebulizer solution Take 3 mLs (2.5 mg total) by nebulization every 6 (six) hours as needed for wheezing or shortness of breath. 01/20/17   [name], Mark, MD  HYDROcodone-acetaminophen (NORCO) 10-325 MG per tablet Take 1 tablet by mouth every 6 (six) hours as needed (pain).    [provider]  ibuprofen (ADVIL,MOTRIN) 600 MG tablet Take 1 tablet (600 mg total) by mouth 3 (three) times daily after meals.  11/23/15   Loren RacerYelverton, David, MD  methocarbamol (ROBAXIN) 500 MG tablet Take 2 tablets (1,000 mg total) by mouth every 8 (eight) hours as needed for muscle spasms. 11/23/15   Loren RacerYelverton, David, MD  naloxone [facility](NARCAN) nasal spray 4 mg/0.1 mL Use in the event of suspected overdose 06/26/16   Willy Eddyobinson, Patrick, MD  naltrexone (DEPADE) 50 MG tablet Take 50 mg by mouth daily. 07/23/13   [provider]  predniSONE (DELTASONE) 20 MG tablet Take 2 tablets (40 mg total) by mouth daily. 01/20/17   [name], Mark, MD    Family History Family History  Problem Relation Age of Onset  . Heart disease Mother   . Stroke Mother   . Hypertension Mother   . Diabetes Mother   . Hypertension Father   . Diabetes Father   . Hypertension Maternal Aunt   . Heart disease Maternal Grandmother   . Cancer Maternal Grandfather        lung CA    Social History Social History   Tobacco Use  . Smoking status: Current Every Day Smoker    Packs/day: 1.00  . Smokeless tobacco: Never Used  Substance Use Topics  . Alcohol use: Yes    Comment: 12 to 18 beers  . Drug use: Yes    Types: Cocaine    Comment: herione and cocaine a few days ago      Allergies  Patient has no known allergies.   Review of Systems Review of Systems  Constitutional: Negative for activity change.       All ROS Neg except as noted in HPI  HENT: Positive for dental problem. Negative for nosebleeds.   Eyes: Negative for photophobia and discharge.  Respiratory: Negative for cough, shortness of breath and wheezing.   Cardiovascular: Negative for chest pain and palpitations.  Gastrointestinal: Negative for abdominal pain and blood in stool.  Genitourinary: Negative for dysuria, frequency and hematuria.  Musculoskeletal: Negative for arthralgias, back pain and neck pain.  Skin: Negative.   Neurological: Negative for dizziness, seizures and speech difficulty.  Psychiatric/Behavioral: Negative for confusion and hallucinations.      Physical Exam Updated Vital Signs BP (!) 144/91 (BP Location: Right Arm)   Pulse 95   Temp 98.5 F (36.9 C) (Oral)   Resp 18   Ht 5\' 10"  (1.778 m)   Wt 81.6 kg (180 lb)   SpO2 100%   BMI 25.83 kg/m   Physical Exam  Constitutional: He is oriented to person, place, and time. He appears well-developed and well-nourished.  Non-toxic appearance.  HENT:  Head: Normocephalic.  Right Ear: Tympanic membrane and external ear normal.  Left Ear: Tympanic membrane and external ear normal.  There are 3 lesions noted on the right side of the face, with facial swelling present.  There are 2 infected areas on the left face with minimal swelling.  The airway is patent.  There is no swelling under the tongue.  There is no significant swelling of the gum on the right or left.  No visible dental caries noted.  Eyes: Pupils are equal, round, and reactive to light. EOM and lids are normal.  Neck: Normal range of motion. Neck supple. Carotid bruit is not present.  Cardiovascular: Normal rate, regular rhythm, normal heart sounds, intact distal pulses and normal pulses.  Pulmonary/Chest: Breath sounds normal. No respiratory distress.  Abdominal: Soft. Bowel sounds are normal. There is no tenderness. There is no guarding.  Musculoskeletal: Normal range of motion.  Lymphadenopathy:       Head (right side): No submandibular adenopathy present.       Head (left side): No submandibular adenopathy present.    He has no cervical adenopathy.  Neurological: He is alert and oriented to person, place, and time. He has normal strength. No cranial nerve deficit or sensory deficit.  Skin: Skin is warm and dry.  Psychiatric: He has a normal mood and affect. His speech is normal.  Nursing note and vitals reviewed.    ED Treatments / Results  Labs (all labs ordered are listed, but only abnormal results are displayed) Labs Reviewed - No data to display  EKG None  Radiology No results  found.  Procedures Procedures (including critical care time)  Medications Ordered in ED Medications - No data to display   Initial Impression / Assessment and Plan / ED Course  I have reviewed the triage vital signs and the nursing notes.  Pertinent labs & imaging results that were available during my care of the patient were reviewed by me and considered in my medical decision making (see chart for details).      Final Clinical Impressions(s) / ED Diagnoses MDM  Vital signs are within normal limits.  No significant dental caries or oral abscess appreciated.  No swelling under the tongue.  The airway is patent.  There are lesions on the face.  The patient reports doing more shaving in the  last few weeks than usual.  He is trying to shape a beard.  I suspect the patient has folliculitis.  The patient will use erythromycin gel 2 times daily.  I have asked him to use regular soap and water to cleanse his face daily and I have asked him to use caution and shaving and going over his face wants instead of repeated shaving and causing folliculitis problems.  Patient is also given a prescription for Amoxil 3 times daily.  The patient is to follow-up with his primary physician or return to the emergency department if not improving.   Final diagnoses:  Folliculitis    ED Discharge Orders        Ordered    erythromycin with ethanol (EMGEL) 2 % gel  Daily     07/12/17 1530    amoxicillin (AMOXIL) 500 MG capsule  3 times daily     07/12/17 1531       Ivery Quale, PA-C 07/12/17 1543    Samuel Jester, DO 07/13/17 [phone]

## 2018-12-04 ENCOUNTER — Encounter (HOSPITAL_COMMUNITY): Payer: Self-pay | Admitting: *Deleted

## 2018-12-04 ENCOUNTER — Inpatient Hospital Stay (HOSPITAL_COMMUNITY)
Admission: EM | Admit: 2018-12-04 | Discharge: 2018-12-09 | DRG: 565 | Discharge status: Against Medical Advice | Payer: Self-pay | Attending: Internal Medicine | Admitting: Internal Medicine

## 2018-12-04 DIAGNOSIS — Z7952 Long term (current) use of systemic steroids: Secondary | ICD-10-CM

## 2018-12-04 DIAGNOSIS — R7989 Other specified abnormal findings of blood chemistry: Secondary | ICD-10-CM

## 2018-12-04 DIAGNOSIS — M21371 Foot drop, right foot: Secondary | ICD-10-CM

## 2018-12-04 DIAGNOSIS — Z880 Allergy status to penicillin: Secondary | ICD-10-CM

## 2018-12-04 DIAGNOSIS — J449 Chronic obstructive pulmonary disease, unspecified: Secondary | ICD-10-CM | POA: Diagnosis present

## 2018-12-04 DIAGNOSIS — F909 Attention-deficit hyperactivity disorder, unspecified type: Secondary | ICD-10-CM | POA: Diagnosis present

## 2018-12-04 DIAGNOSIS — F1110 Opioid abuse, uncomplicated: Secondary | ICD-10-CM

## 2018-12-04 DIAGNOSIS — Z833 Family history of diabetes mellitus: Secondary | ICD-10-CM

## 2018-12-04 DIAGNOSIS — F17210 Nicotine dependence, cigarettes, uncomplicated: Secondary | ICD-10-CM | POA: Diagnosis present

## 2018-12-04 DIAGNOSIS — R740 Nonspecific elevation of levels of transaminase and lactic acid dehydrogenase [LDH]: Secondary | ICD-10-CM | POA: Diagnosis present

## 2018-12-04 DIAGNOSIS — R748 Abnormal levels of other serum enzymes: Secondary | ICD-10-CM

## 2018-12-04 DIAGNOSIS — Z8249 Family history of ischemic heart disease and other diseases of the circulatory system: Secondary | ICD-10-CM

## 2018-12-04 DIAGNOSIS — R531 Weakness: Secondary | ICD-10-CM

## 2018-12-04 DIAGNOSIS — E876 Hypokalemia: Secondary | ICD-10-CM | POA: Diagnosis not present

## 2018-12-04 DIAGNOSIS — Z823 Family history of stroke: Secondary | ICD-10-CM

## 2018-12-04 DIAGNOSIS — F1123 Opioid dependence with withdrawal: Secondary | ICD-10-CM | POA: Diagnosis present

## 2018-12-04 DIAGNOSIS — Z801 Family history of malignant neoplasm of trachea, bronchus and lung: Secondary | ICD-10-CM

## 2018-12-04 DIAGNOSIS — R29898 Other symptoms and signs involving the musculoskeletal system: Secondary | ICD-10-CM | POA: Diagnosis present

## 2018-12-04 DIAGNOSIS — Z20828 Contact with and (suspected) exposure to other viral communicable diseases: Secondary | ICD-10-CM | POA: Diagnosis present

## 2018-12-04 DIAGNOSIS — M609 Myositis, unspecified: Secondary | ICD-10-CM | POA: Diagnosis present

## 2018-12-04 DIAGNOSIS — F419 Anxiety disorder, unspecified: Secondary | ICD-10-CM | POA: Diagnosis present

## 2018-12-04 DIAGNOSIS — J45909 Unspecified asthma, uncomplicated: Secondary | ICD-10-CM

## 2018-12-04 DIAGNOSIS — M21372 Foot drop, left foot: Principal | ICD-10-CM | POA: Diagnosis present

## 2018-12-04 LAB — CBC
HCT: 46.5 % (ref 39.0–52.0)
Hemoglobin: 15.2 g/dL (ref 13.0–17.0)
MCH: 29.9 pg (ref 26.0–34.0)
MCHC: 32.7 g/dL (ref 30.0–36.0)
MCV: 91.4 fL (ref 80.0–100.0)
Platelets: 303 10*3/uL (ref 150–400)
RBC: 5.09 MIL/uL (ref 4.22–5.81)
RDW: 12.8 % (ref 11.5–15.5)
WBC: 8.8 10*3/uL (ref 4.0–10.5)
nRBC: 0 % (ref 0.0–0.2)

## 2018-12-04 LAB — I-STAT CHEM 8, ED
BUN: 20 mg/dL (ref 6–20)
Calcium, Ion: 1.17 mmol/L (ref 1.15–1.40)
Chloride: 96 mmol/L — ABNORMAL LOW (ref 98–111)
Creatinine, Ser: 0.9 mg/dL (ref 0.61–1.24)
Glucose, Bld: 150 mg/dL — ABNORMAL HIGH (ref 70–99)
HCT: 47 % (ref 39.0–52.0)
Hemoglobin: 16 g/dL (ref 13.0–17.0)
Potassium: 4.4 mmol/L (ref 3.5–5.1)
Sodium: 137 mmol/L (ref 135–145)
TCO2: 31 mmol/L (ref 22–32)

## 2018-12-04 NOTE — ED Triage Notes (Addendum)
To ED for eval of bilateral leg weakness for a few days. Pt with noted pea size sores to both legs. States these are due to being hit with rocks when weedeating grass. Denies drug use initially. 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 States he used it to help his legs. No noted sign of infection on visual assessment. Bilateral pedal pulses palpable.

## 2018-12-05 ENCOUNTER — Emergency Department (HOSPITAL_COMMUNITY): Payer: Self-pay

## 2018-12-05 ENCOUNTER — Encounter (HOSPITAL_COMMUNITY): Payer: Self-pay | Admitting: Radiology

## 2018-12-05 DIAGNOSIS — M21372 Foot drop, left foot: Principal | ICD-10-CM

## 2018-12-05 DIAGNOSIS — M21371 Foot drop, right foot: Secondary | ICD-10-CM

## 2018-12-05 LAB — SARS CORONAVIRUS 2 BY RT PCR (HOSPITAL ORDER, PERFORMED IN ~~LOC~~ HOSPITAL LAB): SARS Coronavirus 2: NEGATIVE

## 2018-12-05 LAB — URINALYSIS, ROUTINE W REFLEX MICROSCOPIC
Bilirubin Urine: NEGATIVE
Glucose, UA: 50 mg/dL — AB
Hgb urine dipstick: NEGATIVE
Ketones, ur: 5 mg/dL — AB
Leukocytes,Ua: NEGATIVE
Nitrite: NEGATIVE
Protein, ur: NEGATIVE mg/dL
Specific Gravity, Urine: 1.03 (ref 1.005–1.030)
pH: 5 (ref 5.0–8.0)

## 2018-12-05 LAB — POCT I-STAT EG7
Acid-Base Excess: 4 mmol/L — ABNORMAL HIGH (ref 0.0–2.0)
Bicarbonate: 29.5 mmol/L — ABNORMAL HIGH (ref 20.0–28.0)
Calcium, Ion: 1.02 mmol/L — ABNORMAL LOW (ref 1.15–1.40)
HCT: 40 % (ref 39.0–52.0)
Hemoglobin: 13.6 g/dL (ref 13.0–17.0)
O2 Saturation: 93 %
Potassium: 4.8 mmol/L (ref 3.5–5.1)
Sodium: 139 mmol/L (ref 135–145)
TCO2: 31 mmol/L (ref 22–32)
pCO2, Ven: 44.1 mmHg (ref 44.0–60.0)
pH, Ven: 7.433 — ABNORMAL HIGH (ref 7.250–7.430)
pO2, Ven: 66 mmHg — ABNORMAL HIGH (ref 32.0–45.0)

## 2018-12-05 LAB — COMPREHENSIVE METABOLIC PANEL
ALT: 53 U/L — ABNORMAL HIGH (ref 0–44)
AST: 99 U/L — ABNORMAL HIGH (ref 15–41)
Albumin: 3.8 g/dL (ref 3.5–5.0)
Alkaline Phosphatase: 71 U/L (ref 38–126)
Anion gap: 11 (ref 5–15)
BUN: 14 mg/dL (ref 6–20)
CO2: 27 mmol/L (ref 22–32)
Calcium: 8.9 mg/dL (ref 8.9–10.3)
Chloride: 98 mmol/L (ref 98–111)
Creatinine, Ser: 0.74 mg/dL (ref 0.61–1.24)
GFR calc Af Amer: >60 mL/min (ref >60)
GFR calc non Af Amer: >60 mL/min (ref >60)
Glucose, Bld: 132 mg/dL — ABNORMAL HIGH (ref 70–99)
Potassium: 3.9 mmol/L (ref 3.5–5.1)
Sodium: 136 mmol/L (ref 135–145)
Total Bilirubin: 0.6 mg/dL (ref 0.3–1.2)
Total Protein: 6.7 g/dL (ref 6.5–8.1)

## 2018-12-05 MED ORDER — SODIUM CHLORIDE 0.9 % IV BOLUS
500.0000 mL | Freq: Once | INTRAVENOUS | Status: AC
  Administered 2018-12-05: 500 mL via INTRAVENOUS

## 2018-12-05 MED ORDER — ALBUTEROL SULFATE HFA 108 (90 BASE) MCG/ACT IN AERS
2.0000 | INHALATION_SPRAY | Freq: Once | RESPIRATORY_TRACT | Status: AC
  Administered 2018-12-05: 2 via RESPIRATORY_TRACT
  Filled 2018-12-05: qty 6.7

## 2018-12-05 MED ORDER — IOHEXOL 350 MG/ML SOLN
100.0000 mL | Freq: Once | INTRAVENOUS | Status: AC | PRN
  Administered 2018-12-05: 11:00:00 100 mL via INTRAVENOUS

## 2018-12-05 NOTE — ED Provider Notes (Signed)
Patient sent in by Dr. Ralene Bathe pending MRI of his L-spine which patient was unable to tolerate.  On my exam here patient is vascular intact in his lower extremities.  He is able to perform straight leg raise but states he has no strength when he stands up.  Discussed with Dr. Lorraine Lax from neurology will come and see the patient.  Patient will need to be admitted for further work-up of his condition.   Lacretia Leigh, MD 12/05/18 2232

## 2018-12-05 NOTE — ED Notes (Signed)
Patient transported to CT 

## 2018-12-05 NOTE — ED Notes (Signed)
Patient transported to X-ray 

## 2018-12-05 NOTE — ED Notes (Addendum)
2 Gold tubes sent to lab as well

## 2018-12-05 NOTE — ED Notes (Signed)
Pt contact [phone] Craig Woodward

## 2018-12-05 NOTE — ED Provider Notes (Signed)
[facility] EMERGENCY DEPARTMENT Provider Note   CSN: [id number] Arrival date & time: 12/04/18  2221     History   Chief Complaint Chief Complaint  Patient presents with  . Extremity Weakness    HPI Craig Woodward is a 38 y.o. male.     The history is provided by the patient and medical records. No language interpreter was used.  Extremity Weakness   Craig Woodward is a 38 y.o. male who presents to the Emergency Department complaining of extremity weakness. Yesterday he developed numbness, weakness in BLE, difficulty walking. He states the symptoms began abruptly while he was sitting at the table yesterday. He does use heroin by snorting, last use yesterday.  He complains of associated sob, cough productive of green sputum.  No fever, N/V/D, abdominal pain.  He has some lightheadedness and decreased urinary output. Symptoms are severe and constant nature. Past Medical History:  Diagnosis Date  . ADHD (attention deficit hyperactivity disorder) 12/31/2011  . Allergy   . Asthma    childhood  . Back pain     Patient Active Problem List   Diagnosis Date Noted  . ADHD (attention deficit hyperactivity disorder) 12/31/2011  . Anxiety 12/31/2011    Past Surgical History:  Procedure Laterality Date  . tubes in ears    . TYMPANOSTOMY TUBE PLACEMENT          Home Medications    Prior to Admission medications   Medication Sig Start Date End Date Taking? Authorizing Provider  albuterol (PROVENTIL HFA;VENTOLIN HFA) 108 (90 Base) MCG/ACT inhaler Inhale 2 puffs into the lungs every 6 (six) hours as needed for wheezing or shortness of breath. 05/30/17   Darci Current, MD  albuterol (PROVENTIL) (2.5 MG/3ML) 0.083% nebulizer solution Take 3 mLs (2.5 mg total) by nebulization every 6 (six) hours as needed for wheezing or shortness of breath. 01/20/17   Sharyn Creamer, MD  amoxicillin (AMOXIL) 500 MG capsule Take 1 capsule (500 mg total) by mouth 3 (three) times  daily. 07/12/17   Ivery Quale, PA-C  erythromycin with ethanol (EMGEL) 2 % gel Apply topically daily. 07/12/17   Ivery Quale, PA-C  HYDROcodone-acetaminophen (NORCO) 10-325 MG per tablet Take 1 tablet by mouth every 6 (six) hours as needed (pain).    [provider]  ibuprofen (ADVIL,MOTRIN) 600 MG tablet Take 1 tablet (600 mg total) by mouth 3 (three) times daily after meals. 11/23/15   Loren Racer, MD  methocarbamol (ROBAXIN) 500 MG tablet Take 2 tablets (1,000 mg total) by mouth every 8 (eight) hours as needed for muscle spasms. 11/23/15   Loren Racer, MD  naloxone [place]) nasal spray 4 mg/0.1 mL Use in the event of suspected overdose 06/26/16   Willy Eddy, MD  naltrexone (DEPADE) 50 MG tablet Take 50 mg by mouth daily. 07/23/13   [provider]  predniSONE (DELTASONE) 20 MG tablet Take 2 tablets (40 mg total) by mouth daily. 01/20/17   Sharyn Creamer, MD    Family History Family History  Problem Relation Age of Onset  . Heart disease Mother   . Stroke Mother   . Hypertension Mother   . Diabetes Mother   . Hypertension Father   . Diabetes Father   . Hypertension Maternal Aunt   . Heart disease Maternal Grandmother   . Cancer Maternal Grandfather        lung CA    Social History Social History   Tobacco Use  . Smoking status: Current Every  Day Smoker    Packs/day: 1.00  . Smokeless tobacco: Never Used  Substance Use Topics  . Alcohol use: Yes    Comment: 12 to 18 beers  . Drug use: Yes    Types: Cocaine    Comment: herione and cocaine a few days ago      Allergies   Patient has no known allergies.   Review of Systems Review of Systems  Musculoskeletal: Positive for extremity weakness.  All other systems reviewed and are negative.    Physical Exam Updated Vital Signs BP 119/62   Pulse 95   Temp 98.6 F (37 C) (Oral)   Resp 16   SpO2 93%   Physical Exam Vitals signs and nursing note reviewed.  Constitutional:       Appearance: He is well-developed.  HENT:     Head: Normocephalic and atraumatic.  Cardiovascular:     Rate and Rhythm: Normal rate and regular rhythm.     Heart sounds: No murmur.  Pulmonary:     Effort: Pulmonary effort is normal. No respiratory distress.     Comments: Wheezes bilaterally Abdominal:     Palpations: Abdomen is soft.     Tenderness: There is no abdominal tenderness. There is no guarding or rebound.  Musculoskeletal:        General: No tenderness.     Comments: Absent palpable DP pulses. Unable to Doppler right DP pulses. There is a strong Doppler bowl right PT pulses. There is a faint left DP and PT pulses. Feet are cool to touch.  Skin:    General: Skin is dry.  Neurological:     Mental Status: He is alert and oriented to person, place, and time.     Comments: 4 to 5 strength and bilateral lower extremities, worse over the distal muscle groups. Five out of five strength and bilateral upper extremities. 2+ patellar reflexes bilaterally.  Psychiatric:        Behavior: Behavior normal.      ED Treatments / Results  Labs (all labs ordered are listed, but only abnormal results are displayed) Labs Reviewed  I-STAT CHEM 8, ED - Abnormal; Notable for the following components:      Result Value   Chloride 96 (*)    Glucose, Bld 150 (*)    All other components within normal limits  CBC    EKG None  Radiology No results found.  Procedures Procedures (including critical care time)  Medications Ordered in ED Medications - No data to display   Initial Impression / Assessment and Plan / ED Course  I have reviewed the triage vital signs and the nursing notes.  Pertinent labs & imaging results that were available during my care of the patient were reviewed by me and considered in my medical decision making (see chart for details).        Patient with history of COPD and heroin abuse here for evaluation of weakness to bilateral lower extremities. His feet  are cool with an absent pedal pulses on the right, diminished on the left. A CTA was obtained, which is negative for occlusion. Given his history of drug abuse MRI cervical, thoracic and lumbar were obtained due to his new findings of weakness. Patient care transferred pending MRI. If there are no lesions identified on the study he may require neurology consult.  Final Clinical Impressions(s) / ED Diagnoses   Final diagnoses:  None    ED Discharge Orders    None  Quintella Reichert, MD 12/05/18 [phone]

## 2018-12-05 NOTE — ED Notes (Signed)
[place] called to check on pt

## 2018-12-05 NOTE — H&P (Signed)
History and Physical    Craig Woodward VWU:[id number] DOB: 09/07/1980 DOA: 12/04/2018  PCP: Patient, No Pcp Per Patient coming from: Home  Chief Complaint: Bilateral leg weakness  HPI: Craig Woodward is a 38 y.o. male with medical history significant of heroin abuse, ADHD, allergy, asthma, back pain presenting to the hospital for evaluation of bilateral leg weakness.  Patient states his legs are weak for the past 2 days from the ankle down and he has not been able to feel his feet.  He has not been able to walk.  Denies saddle anesthesia or bladder/fecal incontinence.  Denies any fevers or chills.  States he snorts heroin, last used 3 days ago.  Patient initially denied back pain but then later stated he had some lower back pain while in the MRI machine.  Denies injury to his back or legs.  No additional history could be obtained from him.  ED Course: Hemodynamically stable.  Patient has been in the ED since 12/04/2018 p.m.  CBC done yesterday showing no leukocytosis.  CMP showing elevated transaminases (AST 99, ALT 53).  Alk phos and T bili normal.  SARS-CoV-2 test negative.  ESR pending.  Blood culture x2 pending.  Noted to have cool feet with absent pedal pulses on the right, diminished on the left.  CTA aortobifemoral negative for occlusion.  Given history of drug abuse, MRI of cervical, thoracic, and lumbar spine was ordered.  2 attempts were made to get MRI done but patient was not able to tolerate it.  The examination was limited and motion degraded.  Patient was unable to complete the axial imaging throughout the thoracic spine and the lumbar spine was not imaged. No significant findings seen in the cervical spine.  Several thoracic disc herniations, largest at T8-9 where there is right paracentral disc extrusion with cephalad extension of disc material.  No cord deformity, foraminal compromise or nerve root encroachment identified.  No evidence of discitis or osteomyelitis. Per ED provider,  patient is able to perform straight leg raise but states he has no strength when he stands up. Case was discussed with Dr. Lorraine Lax from neurology who will consult. Patient received a 500 cc fluid bolus. Other imaging done in the ED: Chest x-ray showing findings consistent with COPD and no active cardiopulmonary disease.  Review of Systems:  All systems reviewed and apart from history of presenting illness, are negative.  Past Medical History:  Diagnosis Date   ADHD (attention deficit hyperactivity disorder) 12/31/2011   Allergy    Asthma    childhood   Back pain     Past Surgical History:  Procedure Laterality Date   tubes in ears     TYMPANOSTOMY TUBE PLACEMENT       reports that he has been smoking. He has been smoking about 1.00 pack per day. He has never used smokeless tobacco. He reports current alcohol use. He reports current drug use. Drug: Cocaine.  Allergies  Allergen Reactions   Penicillin G Anaphylaxis    Airway swelling Per pt: he thinks he is allergic    Family History  Problem Relation Age of Onset   Heart disease Mother    Stroke Mother    Hypertension Mother    Diabetes Mother    Hypertension Father    Diabetes Father    Hypertension Maternal Aunt    Heart disease Maternal Grandmother    Cancer Maternal Grandfather        lung CA    Prior to  Admission medications   Medication Sig Start Date End Date Taking? Authorizing Provider  acetaminophen (TYLENOL) 500 MG tablet Take 1,000 mg by mouth every 6 (six) hours as needed for moderate pain.   Yes [provider]  albuterol (PROVENTIL HFA;VENTOLIN HFA) 108 (90 Base) MCG/ACT inhaler Inhale 2 puffs into the lungs every 6 (six) hours as needed for wheezing or shortness of breath. 05/30/17  Yes Gregor Hams, MD  albuterol (PROVENTIL) (2.5 MG/3ML) 0.083% nebulizer solution Take 3 mLs (2.5 mg total) by nebulization every 6 (six) hours as needed for wheezing or shortness of breath.  01/20/17  Yes Delman Kitten, MD  escitalopram (LEXAPRO) 20 MG tablet Take 20 mg by mouth daily. 10/13/18  Yes [provider]  naloxone [facility]) nasal spray 4 mg/0.1 mL Use in the event of suspected overdose 06/26/16  Yes Merlyn Lot, MD  amoxicillin (AMOXIL) 500 MG capsule Take 1 capsule (500 mg total) by mouth 3 (three) times daily. Patient not taking: Reported on 12/05/2018 07/12/17   Lily Kocher, PA-C  erythromycin with ethanol (EMGEL) 2 % gel Apply topically daily. Patient not taking: Reported on 12/05/2018 07/12/17   Lily Kocher, PA-C  ibuprofen (ADVIL,MOTRIN) 600 MG tablet Take 1 tablet (600 mg total) by mouth 3 (three) times daily after meals. Patient not taking: Reported on 12/05/2018 11/23/15   Julianne Rice, MD  methocarbamol (ROBAXIN) 500 MG tablet Take 2 tablets (1,000 mg total) by mouth every 8 (eight) hours as needed for muscle spasms. Patient not taking: Reported on 12/05/2018 11/23/15   Julianne Rice, MD  predniSONE (DELTASONE) 20 MG tablet Take 2 tablets (40 mg total) by mouth daily. Patient not taking: Reported on 12/05/2018 01/20/17   Delman Kitten, MD    Physical Exam: Vitals:   12/05/18 2318 12/06/18 0156 12/06/18 0200 12/06/18 0515  BP: 115/74 132/87 132/81 134/86  Pulse: 65 85 85 91  Resp: '18 18 18 17  '$ Temp: 98.2 F (36.8 C) 98.6 F (37 C) 98.6 F (37 C) 98.2 F (36.8 C)  TempSrc: Oral Axillary Oral   SpO2: 94% 97% 97% 97%  Weight:   70 kg   Height:   6' (1.829 m)     Physical Exam  Constitutional: He is oriented to person, place, and time. He appears well-developed and well-nourished. No distress.  HENT:  Head: Normocephalic.  Mouth/Throat: Oropharynx is clear and moist.  Eyes: Right eye exhibits no discharge. Left eye exhibits no discharge.  Neck: Neck supple.  Cardiovascular: Normal rate, regular rhythm and intact distal pulses.  Pulmonary/Chest: Effort normal and breath sounds normal. No respiratory distress. He has no wheezes. He has no  rales.  Abdominal: Soft. Bowel sounds are normal. He exhibits no distension. There is no abdominal tenderness. There is no guarding.  Musculoskeletal:        General: No tenderness or edema.  Neurological: He is alert and oriented to person, place, and time.  Examination limited due to lack of patient cooperation. Able to raise both legs up against gravity.  Decreased strength on dorsiflexion and plantarflexion bilaterally.  Skin: Skin is warm and dry. He is not diaphoretic.     Labs on Admission: I have personally reviewed following labs and imaging studies  CBC: Recent Labs  Lab 12/04/18 2241 12/04/18 2305 12/05/18 0940  WBC 8.8  --   --   HGB 15.2 16.0 13.6  HCT 46.5 47.0 40.0  MCV 91.4  --   --   PLT 303  --   --  Basic Metabolic Panel: Recent Labs  Lab 12/04/18 2305 12/05/18 0841 12/05/18 0940  NA 137 136 139  K 4.4 3.9 4.8  CL 96* 98  --   CO2  --  27  --   GLUCOSE 150* 132*  --   BUN 20 14  --   CREATININE 0.90 0.74  --   CALCIUM  --  8.9  --    GFR: Estimated Creatinine Clearance: 126.4 mL/min (by C-G formula based on SCr of 0.74 mg/dL). Liver Function Tests: Recent Labs  Lab 12/05/18 0841  AST 99*  ALT 53*  ALKPHOS 71  BILITOT 0.6  PROT 6.7  ALBUMIN 3.8   No results for input(s): LIPASE, AMYLASE in the last 168 hours. No results for input(s): AMMONIA in the last 168 hours. Coagulation Profile: No results for input(s): INR, PROTIME in the last 168 hours. Cardiac Enzymes: No results for input(s): CKTOTAL, CKMB, CKMBINDEX, TROPONINI in the last 168 hours. BNP (last 3 results) No results for input(s): PROBNP in the last 8760 hours. HbA1C: No results for input(s): HGBA1C in the last 72 hours. CBG: No results for input(s): GLUCAP in the last 168 hours. Lipid Profile: No results for input(s): CHOL, HDL, LDLCALC, TRIG, CHOLHDL, LDLDIRECT in the last 72 hours. Thyroid Function Tests: No results for input(s): TSH, T4TOTAL, FREET4, T3FREE, THYROIDAB  in the last 72 hours. Anemia Panel: No results for input(s): VITAMINB12, FOLATE, FERRITIN, TIBC, IRON, RETICCTPCT in the last 72 hours. Urine analysis:    Component Value Date/Time   COLORURINE YELLOW 12/05/2018 [place] 12/05/2018 1245   LABSPEC 1.030 12/05/2018 1245   PHURINE 5.0 12/05/2018 1245   GLUCOSEU 50 (A) 12/05/2018 1245   HGBUR NEGATIVE 12/05/2018 1245   BILIRUBINUR NEGATIVE 12/05/2018 1245   KETONESUR 5 (A) 12/05/2018 1245   PROTEINUR NEGATIVE 12/05/2018 1245   NITRITE NEGATIVE 12/05/2018 1245   LEUKOCYTESUR NEGATIVE 12/05/2018 1245    Radiological Exams on Admission: Dg Chest 2 View  Result Date: 12/05/2018 CLINICAL DATA:  Shortness of breath and wheezing. EXAM: CHEST - 2 VIEW COMPARISON:  Chest x-ray dated May 30, 2017. FINDINGS: The heart size and mediastinal contours are within normal limits. Normal pulmonary vascularity. The lungs are hyperinflated. No focal consolidation, pleural effusion, or pneumothorax. No acute osseous abnormality. IMPRESSION: 1.  No active cardiopulmonary disease. 2. COPD. Electronically Signed   By: Titus Dubin M.D.   On: 12/05/2018 11:32   Mr Cervical Spine Wo Contrast  Result Date: 12/05/2018 CLINICAL DATA:  Bilateral lower extremity weakness and numbness for 2 days. EXAM: MRI CERVICAL AND THORACIC SPINE WITHOUT CONTRAST TECHNIQUE: Multiplanar and multiecho pulse sequences of the cervical spine, to include the craniocervical junction and cervicothoracic junction, and the thoracic spine, were obtained without intravenous contrast. COMPARISON:  Chest CTA 01/20/2017.  Bifemoral runoff CT 12/05/2018. FINDINGS: Despite efforts by the technologist and patient, mild motion artifact is present on today's exam and could not be eliminated. This reduces exam sensitivity and specificity. Patient terminated the examination prior to its completion. No axial imaging through the thoracic spine was obtained. No post-contrast imaging or  lumbar spine imaging was obtained. MRI CERVICAL SPINE FINDINGS Alignment: Normal. Vertebrae: No acute or suspicious osseous findings. Cord: Normal in signal and caliber. Posterior Fossa, vertebral arteries, paraspinal tissues: Visualized portions of the posterior fossa and paraspinal soft tissues appear unremarkable. Bilateral vertebral artery flow voids. Disc levels: Disc space evaluation is limited by motion on the axial images. There is no evidence of discitis or  osteomyelitis. The disc heights are well-maintained. No disc herniation, spinal stenosis or nerve root encroachment. MRI THORACIC SPINE FINDINGS Alignment:  Normal. Vertebrae: No evidence of acute fracture or focal marrow lesion. There is no evidence of discitis or osteomyelitis. Cord: The thoracic cord is normal in signal and caliber. Conus medullaris extends to the upper L1 level. Paraspinal and other soft tissues: No significant paraspinal findings on sagittal imaging. Disc levels: Thoracic disc valuation limited by the lack of axial images. The sagittal images demonstrate a small central disc protrusion at T2-3 without cord deformity or foraminal compromise. At T8-9, there is a right paracentral disc extrusion with cephalad extension of disc material behind the T8 vertebral body. This could be symptomatic, although does not deform the cord or significantly narrow the right foramen. There is a small central disc protrusion at T11 and a shallow right paracentral disc protrusion at T12-L1. No significant foraminal narrowing or nerve root encroachment identified. IMPRESSION: 1. Examination is limited. There is motion on the axial images through the cervical spine. Patient was unable to complete the axial imaging through the thoracic spine, in the lumbar spine was not imaged. 2. No significant findings in the cervical spine. 3. Several thoracic disc herniations are present, largest at T8-9 where there is right paracentral disc extrusion with cephalad  extension of disc material. No cord deformity, foraminal compromise or nerve root encroachment identified. 4. No evidence of discitis or osteomyelitis. Electronically Signed   By: Richardean Sale M.D.   On: 12/05/2018 15:09   Mr Thoracic Spine Wo Contrast  Result Date: 12/05/2018 CLINICAL DATA:  Bilateral lower extremity weakness and numbness for 2 days. EXAM: MRI CERVICAL AND THORACIC SPINE WITHOUT CONTRAST TECHNIQUE: Multiplanar and multiecho pulse sequences of the cervical spine, to include the craniocervical junction and cervicothoracic junction, and the thoracic spine, were obtained without intravenous contrast. COMPARISON:  Chest CTA 01/20/2017.  Bifemoral runoff CT 12/05/2018. FINDINGS: Despite efforts by the technologist and patient, mild motion artifact is present on today's exam and could not be eliminated. This reduces exam sensitivity and specificity. Patient terminated the examination prior to its completion. No axial imaging through the thoracic spine was obtained. No post-contrast imaging or lumbar spine imaging was obtained. MRI CERVICAL SPINE FINDINGS Alignment: Normal. Vertebrae: No acute or suspicious osseous findings. Cord: Normal in signal and caliber. Posterior Fossa, vertebral arteries, paraspinal tissues: Visualized portions of the posterior fossa and paraspinal soft tissues appear unremarkable. Bilateral vertebral artery flow voids. Disc levels: Disc space evaluation is limited by motion on the axial images. There is no evidence of discitis or osteomyelitis. The disc heights are well-maintained. No disc herniation, spinal stenosis or nerve root encroachment. MRI THORACIC SPINE FINDINGS Alignment:  Normal. Vertebrae: No evidence of acute fracture or focal marrow lesion. There is no evidence of discitis or osteomyelitis. Cord: The thoracic cord is normal in signal and caliber. Conus medullaris extends to the upper L1 level. Paraspinal and other soft tissues: No significant paraspinal  findings on sagittal imaging. Disc levels: Thoracic disc valuation limited by the lack of axial images. The sagittal images demonstrate a small central disc protrusion at T2-3 without cord deformity or foraminal compromise. At T8-9, there is a right paracentral disc extrusion with cephalad extension of disc material behind the T8 vertebral body. This could be symptomatic, although does not deform the cord or significantly narrow the right foramen. There is a small central disc protrusion at T11 and a shallow right paracentral disc protrusion at T12-L1. No significant foraminal  narrowing or nerve root encroachment identified. IMPRESSION: 1. Examination is limited. There is motion on the axial images through the cervical spine. Patient was unable to complete the axial imaging through the thoracic spine, in the lumbar spine was not imaged. 2. No significant findings in the cervical spine. 3. Several thoracic disc herniations are present, largest at T8-9 where there is right paracentral disc extrusion with cephalad extension of disc material. No cord deformity, foraminal compromise or nerve root encroachment identified. 4. No evidence of discitis or osteomyelitis. Electronically Signed   By: Richardean Sale M.D.   On: 12/05/2018 15:09   Ct Angio Aortobifemoral W And/or Wo Contrast  Result Date: 12/05/2018 CLINICAL DATA:  38 year old male with bilateral lower extremity weakness and decreased pulses worse on the right than the left EXAM: CT ANGIOGRAPHY OF ABDOMINAL AORTA WITH ILIOFEMORAL RUNOFF TECHNIQUE: Multidetector CT imaging of the abdomen, pelvis and lower extremities was performed using the standard protocol during bolus administration of intravenous contrast. Multiplanar CT image reconstructions and MIPs were obtained to evaluate the vascular anatomy. CONTRAST:  136m OMNIPAQUE IOHEXOL 350 MG/ML SOLN COMPARISON:  Prior CT abdomen and pelvis 05/19/2005 FINDINGS: VASCULAR Aorta: Normal caliber aorta without  aneurysm, dissection, vasculitis or significant stenosis. Celiac: Patent without evidence of aneurysm, dissection, vasculitis or significant stenosis. SMA: Patent without evidence of aneurysm, dissection, vasculitis or significant stenosis. Renals: Both renal arteries are patent without evidence of aneurysm, dissection, vasculitis, fibromuscular dysplasia or significant stenosis. IMA: Patent without evidence of aneurysm, dissection, vasculitis or significant stenosis. RIGHT Lower Extremity Inflow: Common, internal and external iliac arteries are patent without evidence of aneurysm, dissection, vasculitis or significant stenosis. Outflow: Common, superficial and profunda femoral arteries and the popliteal artery are patent without evidence of aneurysm, dissection, vasculitis or significant stenosis. Runoff: Patent three vessel runoff to the ankle. LEFT Lower Extremity Inflow: Common, internal and external iliac arteries are patent without evidence of aneurysm, dissection, vasculitis or significant stenosis. Outflow: Common, superficial and profunda femoral arteries and the popliteal artery are patent without evidence of aneurysm, dissection, vasculitis or significant stenosis. Runoff: Patent three vessel runoff to the ankle. Veins: No obvious venous abnormality within the limitations of this arterial phase study. Review of the MIP images confirms the above findings. NON-VASCULAR Lower chest: The lung bases are clear. Visualized cardiac structures are within normal limits for size. No pericardial effusion. Unremarkable visualized distal thoracic esophagus. Hepatobiliary: No focal liver abnormality is seen. No gallstones, gallbladder wall thickening, or biliary dilatation. Pancreas: Unremarkable. No pancreatic ductal dilatation or surrounding inflammatory changes. Spleen: Normal in size without focal abnormality. Adrenals/Urinary Tract: Adrenal glands are unremarkable. Kidneys are normal, without renal calculi, focal  lesion, or hydronephrosis. Bladder is unremarkable. Stomach/Bowel: Stomach is within normal limits. Appendix appears normal. No evidence of bowel wall thickening, distention, or inflammatory changes. Lymphatic: No suspicious lymphadenopathy. Reproductive: Prostate is unremarkable. Other: No evidence of abdominal wall hernia or ascites. The bladder is full. Musculoskeletal: Lower lumbar degenerative disc disease with broad-based disc bulges at L4-L5 and L5-S1. IMPRESSION: VASCULAR 1. Normal CTA abdomen and pelvis with bilateral lower extremity runoff. NON-VASCULAR 1. Lower lumbar degenerative disc disease at L4-L5 and L5-S1. Electronically Signed   By: HJacqulynn CadetM.D.   On: 12/05/2018 11:00    EKG: Independently reviewed.  Sinus rhythm, heart rate 76.  Assessment/Plan Principal Problem:   Foot drop, bilateral Active Problems:   Anxiety   Abnormal transaminases   Heroin abuse (HCC)   Asthma   Bilateral foot drop Afebrile and no  leukocytosis.  ESR and CRP normal.  CTA aortobifemoral negative for occlusion.  2 attempts were made in the ED to get MRI done but patient was not able to tolerate it.  The examination was limited and motion degraded.  Patient was unable to complete the axial imaging throughout the thoracic spine and the lumbar spine was not imaged. No significant findings seen in the cervical spine.  Several thoracic disc herniations, largest at T8-9 where there is right paracentral disc extrusion with cephalad extension of disc material.  No cord deformity, foraminal compromise or nerve root encroachment identified.  No evidence of discitis or osteomyelitis. Patient was seen by neurology and noted to have hip flexion weakness and brisk patellar reflexes in addition to bilateral foot drop on exam.  Differential diagnoses include conus medullaris, cauda equina syndrome, spinal epidural abscess given history of drug abuse, spinal stenosis with L5/S1 radiculopathy, and bilateral peroneal  neuropathy.  Neurology recommended neurosurgery consult. -Case was discussed with Margo Aye NP with neurosurgery.  Recommended reconsulting neurosurgery after lumbar MRI results are available. -Dexamethasone 10 mg -MRI lumbar spine pending.  Medications ordered for pain and anxiety. -PT evaluation -Frequent neurochecks -Blood culture x2 pending  Elevated transaminases AST 99, ALT 53.  Alk phos and T bili normal. -Right upper quadrant ultrasound  Heroin abuse No signs of opiate withdrawal at this time. -Social work consult  Asthma -Stable.  Albuterol PRN.  Anxiety -Continue Lexapro  DVT prophylaxis: Subcutaneous heparin Code Status: Full code Family Communication: No family available. Disposition Plan: Anticipate discharge in 1 to 2 days. Consults called: Neurosurgery, neurology Admission status: It is my clinical opinion that referral for OBSERVATION is reasonable and necessary in this patient based on the above information provided. The aforementioned taken together are felt to place the patient at high risk for further clinical deterioration. However it is anticipated that the patient may be medically stable for discharge from the hospital within 24 to 48 hours.  The medical decision making on this patient was of high complexity and the patient is at high risk for clinical deterioration, therefore this is a level 3 visit.  Shela Leff MD [facility] Pager [phone]  If 7PM-7AM, please contact night-coverage [email or web address] Password [email or web address]  12/06/2018, 5:59 AM

## 2018-12-05 NOTE — Progress Notes (Signed)
Attempted to get pt for MRI and he is refusing.

## 2018-12-05 NOTE — ED Notes (Signed)
Patient transported to MRI 

## 2018-12-05 NOTE — ED Notes (Signed)
Pt attempting to use urinal.

## 2018-12-06 ENCOUNTER — Observation Stay (HOSPITAL_COMMUNITY): Payer: Self-pay

## 2018-12-06 ENCOUNTER — Other Ambulatory Visit: Payer: Self-pay

## 2018-12-06 DIAGNOSIS — R29898 Other symptoms and signs involving the musculoskeletal system: Secondary | ICD-10-CM

## 2018-12-06 DIAGNOSIS — J45909 Unspecified asthma, uncomplicated: Secondary | ICD-10-CM

## 2018-12-06 DIAGNOSIS — M21372 Foot drop, left foot: Secondary | ICD-10-CM

## 2018-12-06 DIAGNOSIS — R748 Abnormal levels of other serum enzymes: Secondary | ICD-10-CM

## 2018-12-06 DIAGNOSIS — F1110 Opioid abuse, uncomplicated: Secondary | ICD-10-CM

## 2018-12-06 DIAGNOSIS — M21371 Foot drop, right foot: Secondary | ICD-10-CM

## 2018-12-06 LAB — MAGNESIUM: Magnesium: 2.2 mg/dL (ref 1.7–2.4)

## 2018-12-06 LAB — C-REACTIVE PROTEIN: CRP: <0.8 mg/dL (ref <1.0)

## 2018-12-06 LAB — RAPID URINE DRUG SCREEN, HOSP PERFORMED
Amphetamines: POSITIVE — AB
Barbiturates: NOT DETECTED
Benzodiazepines: POSITIVE — AB
Cocaine: NOT DETECTED
Opiates: POSITIVE — AB
Tetrahydrocannabinol: NOT DETECTED

## 2018-12-06 LAB — PHOSPHORUS: Phosphorus: 1.9 mg/dL — ABNORMAL LOW (ref 2.5–4.6)

## 2018-12-06 LAB — SEDIMENTATION RATE: Sed Rate: 6 mm/hr (ref 0–16)

## 2018-12-06 MED ORDER — ACETAMINOPHEN 325 MG PO TABS: 650.0000 mg | ORAL_TABLET | Freq: Four times a day (QID) | ORAL | Status: DC | PRN

## 2018-12-06 MED ORDER — ALBUTEROL SULFATE (2.5 MG/3ML) 0.083% IN NEBU: 2.5000 mg | INHALATION_SOLUTION | Freq: Four times a day (QID) | RESPIRATORY_TRACT | Status: DC | PRN

## 2018-12-06 MED ORDER — ACETAMINOPHEN 650 MG RE SUPP: 650.0000 mg | Freq: Four times a day (QID) | RECTAL | Status: DC | PRN

## 2018-12-06 MED ORDER — DEXAMETHASONE SODIUM PHOSPHATE 10 MG/ML IJ SOLN
10.0000 mg | Freq: Once | INTRAMUSCULAR | Status: AC
  Administered 2018-12-06: 10 mg via INTRAVENOUS
  Filled 2018-12-06: qty 1

## 2018-12-06 MED ORDER — HEPARIN SODIUM (PORCINE) 5000 UNIT/ML IJ SOLN
5000.0000 [IU] | Freq: Three times a day (TID) | INTRAMUSCULAR | Status: DC
  Administered 2018-12-06 – 2018-12-09 (×10): 5000 [IU] via SUBCUTANEOUS
  Filled 2018-12-06 (×10): qty 1

## 2018-12-06 MED ORDER — DIAZEPAM 5 MG PO TABS: 10.0000 mg | ORAL_TABLET | Freq: Once | ORAL | Status: DC

## 2018-12-06 MED ORDER — DEXAMETHASONE SODIUM PHOSPHATE 10 MG/ML IJ SOLN: 10.0000 mg | Freq: Once | INTRAMUSCULAR | Status: DC

## 2018-12-06 MED ORDER — CHLORDIAZEPOXIDE HCL 5 MG PO CAPS
5.0000 mg | ORAL_CAPSULE | Freq: Three times a day (TID) | ORAL | Status: AC
  Administered 2018-12-06 – 2018-12-07 (×3): 5 mg via ORAL
  Filled 2018-12-06 (×3): qty 1

## 2018-12-06 MED ORDER — TRAMADOL HCL 50 MG PO TABS
50.0000 mg | ORAL_TABLET | Freq: Four times a day (QID) | ORAL | Status: DC | PRN
  Administered 2018-12-06 – 2018-12-09 (×6): 50 mg via ORAL
  Filled 2018-12-06 (×6): qty 1

## 2018-12-06 MED ORDER — LORAZEPAM 2 MG/ML IJ SOLN: 0.5000 mg | Freq: Once | INTRAMUSCULAR | Status: DC

## 2018-12-06 MED ORDER — ESCITALOPRAM OXALATE 20 MG PO TABS
20.0000 mg | ORAL_TABLET | Freq: Every day | ORAL | Status: DC
  Administered 2018-12-06 – 2018-12-09 (×4): 20 mg via ORAL
  Filled 2018-12-06 (×4): qty 1

## 2018-12-06 NOTE — Progress Notes (Signed)
PT Cancellation Note  Patient Details Name: Craig Woodward MRN: [id number] DOB: 31-Jul-1980   Cancelled Treatment:    Reason Eval/Treat Not Completed: Medical issues which prohibited therapy. Pt is refusing PT eval, reporting he is going through withdrawals.  Will try again at another time.   Ramond Dial 12/06/2018, 12:41 PM   Mee Hives, PT MS Acute Rehab Dept. Number: [place]

## 2018-12-06 NOTE — Plan of Care (Signed)

## 2018-12-06 NOTE — Progress Notes (Signed)
   12/06/18 2151  MEWS Score  Resp 19  Pulse Rate 72  BP 107/63  Temp 98 F (36.7 C)  SpO2 98 %  O2 Device Room Air  MEWS Score  MEWS RR 0  MEWS Pulse 0  MEWS Systolic 0  MEWS LOC 1  MEWS Temp 0  MEWS Score 1  MEWS Score Color Green  MEWS Assessment  Is this an acute change? No  MEWS Guidelines - (patients age 36 and over)  Red - At High Risk for Deterioration Yellow - At risk for Deterioration  1. Go to room and assess patient 2. Validate data. Is this patient's baseline? If data confirmed: 3. Is this an acute change? 4. Administer prn meds/treatments as ordered. 5. Note Sepsis score 6. Review goals of care 7. Sports coach, RRT nurse and Provider. 8. Ask Provider to come to bedside.  9. Document patient condition/interventions/response. 10. Increase frequency of vital signs and focused assessments to at least q15 minutes x 4, then q30 minutes x2. - If stable, then q1h x3, then q4h x3 and then q8h or dept. routine. - If unstable, contact Provider & RRT nurse. Prepare for possible transfer. 11. Add entry in progress notes using the smart phrase ".MEWS". 1. Go to room and assess patient 2. Validate data. Is this patient's baseline? If data confirmed: 3. Is this an acute change? 4. Administer prn meds/treatments as ordered? 5. Note Sepsis score 6. Review goals of care 7. Sports coach and Provider 8. Call RRT nurse as needed. 9. Document patient condition/interventions/response. 10. Increase frequency of vital signs and focused assessments to at least q2h x2. - If stable, then q4h x2 and then q8h or dept. routine. - If unstable, contact Provider & RRT nurse. Prepare for possible transfer. 11. Add entry in progress notes using the smart phrase ".MEWS".  Green - Likely stable Lavender - Comfort Care Only  1. Continue routine/ordered monitoring.  2. Review goals of care. 1. Continue routine/ordered monitoring. 2. Review goals of care.

## 2018-12-06 NOTE — ED Notes (Signed)
ED TO INPATIENT HANDOFF REPORT  ED Nurse Name and Phone #:   Alan Ripperndrew RN [phone]  S Name/Age/Gender Craig Moorobert D Woodward 38 y.o. male Room/Bed: 023C/023C  Code Status   Code Status: Prior  Home/SNF/Other Home Patient oriented to: self, place, time and situation Is this baseline? Yes   Triage Complete: Triage complete  Chief Complaint Leg Tingling  Triage Note To ED for eval of bilateral leg weakness for a few days. Pt with noted pea size sores to both legs. States these are due to being hit with rocks when weedeating grass. Denies drug use initially. 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 States he used it to help his legs. No noted sign of infection on visual assessment. Bilateral pedal pulses palpable.   Allergies Allergies  Allergen Reactions  . Penicillin G Anaphylaxis    Airway swelling Per pt: he thinks he is allergic    Level of Care/Admitting Diagnosis ED Disposition    ED Disposition Condition Comment   Admit  [facility] [[id number]]  Level of Care: Med-Surg [16]  I expect the patient will be discharged within 24 hours: Yes  LOW acuity---Tx typically complete <24 hrs---ACUTE conditions typically can be evaluated <24 hours---LABS likely to return to acceptable levels <24 hours---IS near functional baseline---EXPECTED to return to current living arrangement---NOT newly hypoxic: Meets criteria for 5C-Observation unit  Covid Evaluation: Confirmed COVID Negative  Diagnosis: Lower extremity weakness [[id number]][[id number]]  Admitting Physician: John GiovanniRATHORE, VASUNDHRA [[id number]]  Attending Physician: John GiovanniATHORE, VASUNDHRA [[id number]]  PT Class (Do Not Modify): Observation [104]  PT Acc Code (Do Not Modify): Observation [10022]       B Medical/Surgery History Past Medical History:  Diagnosis Date  . ADHD (attention deficit hyperactivity disorder) 12/31/2011  . Allergy   . Asthma    childhood  . Back  pain    Past Surgical History:  Procedure Laterality Date  . tubes in ears    . TYMPANOSTOMY TUBE PLACEMENT       A IV Location/Drains/Wounds Patient Lines/Drains/Airways Status   Active Line/Drains/Airways    Name:   Placement date:   Placement time:   Site:   Days:   Peripheral IV 12/05/18 Right Antecubital   12/05/18    0856    Antecubital   1          Intake/Output Last 24 hours  Intake/Output Summary (Last 24 hours) at 12/06/2018 0113 Last data filed at 12/05/2018 1343 Gross per 24 hour  Intake 500 ml  Output -  Net 500 ml    Labs/Imaging Results for orders placed or performed during the hospital encounter of 12/04/18 (from the past 48 hour(s))  CBC     Status: None   Collection Time: 12/04/18 10:41 PM  Result Value Ref Range   WBC 8.8 4.0 - 10.5 K/uL   RBC 5.09 4.22 - 5.81 MIL/uL   Hemoglobin 15.2 13.0 - 17.0 g/dL   HCT 08.646.5 57.839.0 - 46.952.0 %   MCV 91.4 80.0 - 100.0 fL   MCH 29.9 26.0 - 34.0 pg   MCHC 32.7 30.0 - 36.0 g/dL   RDW 62.912.8 52.811.5 - 41.315.5 %   Platelets 303 150 - 400 K/uL   nRBC 0.0 0.0 - 0.2 %    Comment: Performed at [facility], [address] [place], [place] [address]  I-stat chem 8, ED (not at [facility] or [facility])  Status: Abnormal   Collection Time: 12/04/18 11:05 PM  Result Value Ref Range   Sodium 137 135 - 145 mmol/L   Potassium 4.4 3.5 - 5.1 mmol/L   Chloride 96 (L) 98 - 111 mmol/L   BUN 20 6 - 20 mg/dL   Creatinine, Ser 6.80 0.61 - 1.24 mg/dL   Glucose, Bld 321 (H) 70 - 99 mg/dL   Calcium, Ion 2.24 8.25 - 1.40 mmol/L   TCO2 31 22 - 32 mmol/L   Hemoglobin 16.0 13.0 - 17.0 g/dL   HCT 00.3 70.4 - 88.8 %  Comprehensive metabolic panel     Status: Abnormal   Collection Time: 12/05/18  8:41 AM  Result Value Ref Range   Sodium 136 135 - 145 mmol/L   Potassium 3.9 3.5 - 5.1 mmol/L   Chloride 98 98 - 111 mmol/L   CO2 27 22 - 32 mmol/L   Glucose, Bld 132 (H) 70 - 99 mg/dL   BUN 14 6 - 20 mg/dL   Creatinine, Ser 9.16 0.61 - 1.24 mg/dL    Calcium 8.9 8.9 - 94.5 mg/dL   Total Protein 6.7 6.5 - 8.1 g/dL   Albumin 3.8 3.5 - 5.0 g/dL   AST 99 (H) 15 - 41 U/L   ALT 53 (H) 0 - 44 U/L   Alkaline Phosphatase 71 38 - 126 U/L   Total Bilirubin 0.6 0.3 - 1.2 mg/dL   GFR calc non Af Amer >60 >60 mL/min   GFR calc Af Amer >60 >60 mL/min   Anion gap 11 5 - 15    Comment: Performed at [facility], [address], [place], [place] [address]  SARS Coronavirus 2 [facility] order, Performed in [facility] hospital lab) Nasopharyngeal Nasopharyngeal Swab     Status: None   Collection Time: 12/05/18  8:43 AM   Specimen: Nasopharyngeal Swab  Result Value Ref Range   SARS Coronavirus 2 NEGATIVE NEGATIVE    Comment: (NOTE) If result is NEGATIVE SARS-CoV-2 target nucleic acids are NOT DETECTED. The SARS-CoV-2 RNA is generally detectable in upper and lower  respiratory specimens during the acute phase of infection. The lowest  concentration of SARS-CoV-2 viral copies this assay can detect is 250  copies / mL. A negative result does not preclude SARS-CoV-2 infection  and should not be used as the sole basis for treatment or other  patient management decisions.  A negative result may occur with  improper specimen collection / handling, submission of specimen other  than nasopharyngeal swab, presence of viral mutation(s) within the  areas targeted by this assay, and inadequate number of viral copies  (<250 copies / mL). A negative result must be combined with clinical  observations, patient history, and epidemiological information. If result is POSITIVE SARS-CoV-2 target nucleic acids are DETECTED. The SARS-CoV-2 RNA is generally detectable in upper and lower  respiratory specimens dur ing the acute phase of infection.  Positive  results are indicative of active infection with SARS-CoV-2.  Clinical  correlation with patient history and other diagnostic information is  necessary to determine patient infection status.  Positive results do   not rule out bacterial infection or co-infection with other viruses. If result is PRESUMPTIVE POSTIVE SARS-CoV-2 nucleic acids MAY BE PRESENT.   A presumptive positive result was obtained on the submitted specimen  and confirmed on repeat testing.  While 2019 novel coronavirus  (SARS-CoV-2) nucleic acids may be present in the submitted sample  additional confirmatory testing may be necessary for  epidemiological  and / or clinical management purposes  to differentiate between  SARS-CoV-2 and other Sarbecovirus currently known to infect humans.  If clinically indicated additional testing with an alternate test  methodology [phone]) is advised. The SARS-CoV-2 RNA is generally  detectable in upper and lower respiratory sp ecimens during the acute  phase of infection. The expected result is Negative. Fact Sheet for Patients:  StrictlyIdeas.no Fact Sheet for Healthcare Providers: BankingDealers.co.za This test is not yet approved or cleared by the [place] FDA and has been authorized for detection and/or diagnosis of SARS-CoV-2 by FDA under an Emergency Use Authorization (EUA).  This EUA will remain in effect (meaning this test can be used) for the duration of the COVID-19 declaration under Section 564(b)(1) of the Act, 21 U.S.C. section 360bbb-3(b)(1), unless the authorization is terminated or revoked sooner. Performed at [facility], [place] [address] [place], [place] [address]   POCT I-Stat EG7     Status: Abnormal   Collection Time: 12/05/18  9:40 AM  Result Value Ref Range   pH, Ven 7.433 (H) 7.250 - 7.430   pCO2, Ven 44.1 44.0 - 60.0 mmHg   pO2, Ven 66.0 (H) 32.0 - 45.0 mmHg   Bicarbonate 29.5 (H) 20.0 - 28.0 mmol/L   TCO2 31 22 - 32 mmol/L   O2 Saturation 93.0 %   Acid-Base Excess 4.0 (H) 0.0 - 2.0 mmol/L   Sodium 139 135 - 145 mmol/L   Potassium 4.8 3.5 - 5.1 mmol/L   Calcium, Ion 1.02 (L) 1.15 - 1.40 mmol/L   HCT  40.0 39.0 - 52.0 %   Hemoglobin 13.6 13.0 - 17.0 g/dL   Patient temperature HIDE    Sample type VENOUS   Urinalysis, Routine w reflex microscopic     Status: Abnormal   Collection Time: 12/05/18 12:45 PM  Result Value Ref Range   Color, Urine YELLOW YELLOW   APPearance CLEAR CLEAR   Specific Gravity, Urine 1.030 1.005 - 1.030   pH 5.0 5.0 - 8.0   Glucose, UA 50 (A) NEGATIVE mg/dL   Hgb urine dipstick NEGATIVE NEGATIVE   Bilirubin Urine NEGATIVE NEGATIVE   Ketones, ur 5 (A) NEGATIVE mg/dL   Protein, ur NEGATIVE NEGATIVE mg/dL   Nitrite NEGATIVE NEGATIVE   Leukocytes,Ua NEGATIVE NEGATIVE    Comment: Performed at [facility], [place] [address], [place], [place] [address]   Dg Chest 2 View  Result Date: 12/05/2018 CLINICAL DATA:  Shortness of breath and wheezing. EXAM: CHEST - 2 VIEW COMPARISON:  Chest x-ray dated May 30, 2017. FINDINGS: The heart size and mediastinal contours are within normal limits. Normal pulmonary vascularity. The lungs are hyperinflated. No focal consolidation, pleural effusion, or pneumothorax. No acute osseous abnormality. IMPRESSION: 1.  No active cardiopulmonary disease. 2. COPD. Electronically Signed   By: Titus Dubin M.D.   On: 12/05/2018 11:32   Mr Cervical Spine Wo Contrast  Result Date: 12/05/2018 CLINICAL DATA:  Bilateral lower extremity weakness and numbness for 2 days. EXAM: MRI CERVICAL AND THORACIC SPINE WITHOUT CONTRAST TECHNIQUE: Multiplanar and multiecho pulse sequences of the cervical spine, to include the craniocervical junction and cervicothoracic junction, and the thoracic spine, were obtained without intravenous contrast. COMPARISON:  Chest CTA 01/20/2017.  Bifemoral runoff CT 12/05/2018. FINDINGS: Despite efforts by the technologist and patient, mild motion artifact is present on today's exam and could not be eliminated. This reduces exam sensitivity and specificity. Patient terminated the examination prior to its completion. No axial  imaging through the thoracic spine was obtained. No post-contrast imaging or lumbar spine imaging was obtained. MRI CERVICAL SPINE FINDINGS Alignment: Normal. Vertebrae: No acute or suspicious osseous findings. Cord: Normal in signal and caliber. Posterior Fossa, vertebral arteries, paraspinal tissues: Visualized portions of the posterior fossa and paraspinal soft tissues appear unremarkable. Bilateral vertebral artery flow voids. Disc levels: Disc space evaluation is limited by motion on the axial images. There is no evidence of discitis or osteomyelitis. The disc heights are well-maintained. No disc herniation, spinal stenosis or nerve root encroachment. MRI THORACIC SPINE FINDINGS Alignment:  Normal. Vertebrae: No evidence of acute fracture or focal marrow lesion. There is no evidence of discitis or osteomyelitis. Cord: The thoracic cord is normal in signal and caliber. Conus medullaris extends to the upper L1 level. Paraspinal and other soft tissues: No significant paraspinal findings on sagittal imaging. Disc levels: Thoracic disc valuation limited by the lack of axial images. The sagittal images demonstrate a small central disc protrusion at T2-3 without cord deformity or foraminal compromise. At T8-9, there is a right paracentral disc extrusion with cephalad extension of disc material behind the T8 vertebral body. This could be symptomatic, although does not deform the cord or significantly narrow the right foramen. There is a small central disc protrusion at T11 and a shallow right paracentral disc protrusion at T12-L1. No significant foraminal narrowing or nerve root encroachment identified. IMPRESSION: 1. Examination is limited. There is motion on the axial images through the cervical spine. Patient was unable to complete the axial imaging through the thoracic spine, in the lumbar spine was not imaged. 2. No significant findings in the cervical spine. 3. Several thoracic disc herniations are present,  largest at T8-9 where there is right paracentral disc extrusion with cephalad extension of disc material. No cord deformity, foraminal compromise or nerve root encroachment identified. 4. No evidence of discitis or osteomyelitis. Electronically Signed   By: Carey Bullocks M.D.   On: 12/05/2018 15:09   Mr Thoracic Spine Wo Contrast  Result Date: 12/05/2018 CLINICAL DATA:  Bilateral lower extremity weakness and numbness for 2 days. EXAM: MRI CERVICAL AND THORACIC SPINE WITHOUT CONTRAST TECHNIQUE: Multiplanar and multiecho pulse sequences of the cervical spine, to include the craniocervical junction and cervicothoracic junction, and the thoracic spine, were obtained without intravenous contrast. COMPARISON:  Chest CTA 01/20/2017.  Bifemoral runoff CT 12/05/2018. FINDINGS: Despite efforts by the technologist and patient, mild motion artifact is present on today's exam and could not be eliminated. This reduces exam sensitivity and specificity. Patient terminated the examination prior to its completion. No axial imaging through the thoracic spine was obtained. No post-contrast imaging or lumbar spine imaging was obtained. MRI CERVICAL SPINE FINDINGS Alignment: Normal. Vertebrae: No acute or suspicious osseous findings. Cord: Normal in signal and caliber. Posterior Fossa, vertebral arteries, paraspinal tissues: Visualized portions of the posterior fossa and paraspinal soft tissues appear unremarkable. Bilateral vertebral artery flow voids. Disc levels: Disc space evaluation is limited by motion on the axial images. There is no evidence of discitis or osteomyelitis. The disc heights are well-maintained. No disc herniation, spinal stenosis or nerve root encroachment. MRI THORACIC SPINE FINDINGS Alignment:  Normal. Vertebrae: No evidence of acute fracture or focal marrow lesion. There is no evidence of discitis or osteomyelitis. Cord: The thoracic cord is normal in signal and caliber. Conus medullaris extends to the upper  L1 level. Paraspinal and other soft tissues: No significant paraspinal findings on sagittal imaging. Disc levels: Thoracic disc valuation limited by the  lack of axial images. The sagittal images demonstrate a small central disc protrusion at T2-3 without cord deformity or foraminal compromise. At T8-9, there is a right paracentral disc extrusion with cephalad extension of disc material behind the T8 vertebral body. This could be symptomatic, although does not deform the cord or significantly narrow the right foramen. There is a small central disc protrusion at T11 and a shallow right paracentral disc protrusion at T12-L1. No significant foraminal narrowing or nerve root encroachment identified. IMPRESSION: 1. Examination is limited. There is motion on the axial images through the cervical spine. Patient was unable to complete the axial imaging through the thoracic spine, in the lumbar spine was not imaged. 2. No significant findings in the cervical spine. 3. Several thoracic disc herniations are present, largest at T8-9 where there is right paracentral disc extrusion with cephalad extension of disc material. No cord deformity, foraminal compromise or nerve root encroachment identified. 4. No evidence of discitis or osteomyelitis. Electronically Signed   By: Carey Bullocks M.D.   On: 12/05/2018 15:09   Ct Angio Aortobifemoral W And/or Wo Contrast  Result Date: 12/05/2018 CLINICAL DATA:  38 year old male with bilateral lower extremity weakness and decreased pulses worse on the right than the left EXAM: CT ANGIOGRAPHY OF ABDOMINAL AORTA WITH ILIOFEMORAL RUNOFF TECHNIQUE: Multidetector CT imaging of the abdomen, pelvis and lower extremities was performed using the standard protocol during bolus administration of intravenous contrast. Multiplanar CT image reconstructions and MIPs were obtained to evaluate the vascular anatomy. CONTRAST:  OMNIPAQUE IOHEXOL 350 MG/ML SOLN COMPARISON:  Prior CT abdomen and pelvis  05/19/2005 FINDINGS: VASCULAR Aorta: Normal caliber aorta without aneurysm, dissection, vasculitis or significant stenosis. Celiac: Patent without evidence of aneurysm, dissection, vasculitis or significant stenosis. SMA: Patent without evidence of aneurysm, dissection, vasculitis or significant stenosis. Renals: Both renal arteries are patent without evidence of aneurysm, dissection, vasculitis, fibromuscular dysplasia or significant stenosis. IMA: Patent without evidence of aneurysm, dissection, vasculitis or significant stenosis. RIGHT Lower Extremity Inflow: Common, internal and external iliac arteries are patent without evidence of aneurysm, dissection, vasculitis or significant stenosis. Outflow: Common, superficial and profunda femoral arteries and the popliteal artery are patent without evidence of aneurysm, dissection, vasculitis or significant stenosis. Runoff: Patent three vessel runoff to the ankle. LEFT Lower Extremity Inflow: Common, internal and external iliac arteries are patent without evidence of aneurysm, dissection, vasculitis or significant stenosis. Outflow: Common, superficial and profunda femoral arteries and the popliteal artery are patent without evidence of aneurysm, dissection, vasculitis or significant stenosis. Runoff: Patent three vessel runoff to the ankle. Veins: No obvious venous abnormality within the limitations of this arterial phase study. Review of the MIP images confirms the above findings. NON-VASCULAR Lower chest: The lung bases are clear. Visualized cardiac structures are within normal limits for size. No pericardial effusion. Unremarkable visualized distal thoracic esophagus. Hepatobiliary: No focal liver abnormality is seen. No gallstones, gallbladder wall thickening, or biliary dilatation. Pancreas: Unremarkable. No pancreatic ductal dilatation or surrounding inflammatory changes. Spleen: Normal in size without focal abnormality. Adrenals/Urinary Tract: Adrenal glands  are unremarkable. Kidneys are normal, without renal calculi, focal lesion, or hydronephrosis. Bladder is unremarkable. Stomach/Bowel: Stomach is within normal limits. Appendix appears normal. No evidence of bowel wall thickening, distention, or inflammatory changes. Lymphatic: No suspicious lymphadenopathy. Reproductive: Prostate is unremarkable. Other: No evidence of abdominal wall hernia or ascites. The bladder is full. Musculoskeletal: Lower lumbar degenerative disc disease with broad-based disc bulges at L4-L5 and L5-S1. IMPRESSION: VASCULAR 1. Normal CTA abdomen  and pelvis with bilateral lower extremity runoff. NON-VASCULAR 1. Lower lumbar degenerative disc disease at L4-L5 and L5-S1. Electronically Signed   By: Malachy MoanHeath  McCullough M.D.   On: 12/05/2018 11:00    Pending Labs Unresulted Labs (From admission, onward)    Start     Ordered   12/05/18 2220  Culture, blood (Routine X 2) w Reflex to ID Panel  BLOOD CULTURE X 2,   R (with STAT occurrences)     12/05/18 2219   12/05/18 2219  Sedimentation rate  ONCE - STAT,   STAT     12/05/18 2219   Signed and Held  C-reactive protein  Add-on,   R     Signed and Held   Signed and Held  HIV antibody (Routine Testing)  Once,   R     Signed and Held          Vitals/Pain Today's Vitals   12/05/18 1745 12/05/18 1800 12/05/18 2317 12/05/18 2318  BP: 103/60 105/70  115/74  Pulse: 60 (!) 59  65  Resp:  18  18  Temp:    98.2 F (36.8 C)  TempSrc:    Oral  SpO2: 93% 95%  94%  PainSc:   5      Isolation Precautions No active isolations  Medications Medications  albuterol (VENTOLIN HFA) 108 (90 Base) MCG/ACT inhaler 2 puff (2 puffs Inhalation Given 12/05/18 0924)  sodium chloride 0.9 % bolus 500 mL (0 mLs Intravenous Stopped 12/05/18 1343)  iohexol (OMNIPAQUE) 350 MG/ML injection 100 mL (100 mLs Intravenous Contrast Given 12/05/18 1045)    Mobility walks High fall risk   Focused Assessments    R Recommendations: See Admitting Provider  Note  Report given to:   Additional Notes:

## 2018-12-06 NOTE — Progress Notes (Signed)
This nurse was notified by MRI that patient had been refusing to go for ordered MRI procedures.  They requested that the orders be cancelled due to patient refusal.  This nurse asked the patient if he would be willing to go for his ordered MRI's tonight and he again refused.  Paged [facility] provider Jeannette Corpus, NP, who stated that attending physician would have to determine whether or not to cancel the order.  Orders are left in place as written until modified by the attending.

## 2018-12-06 NOTE — Progress Notes (Signed)
12/06/18 @ 9:23a.  Pt still refusing exam.

## 2018-12-06 NOTE — Consult Note (Signed)
Reason for Consult: Back pain leg weakness Referring Physician: Emergency department tried hospitalist  Kellie MoorRobert D Bennion is an 38 y.o. male.  HPI: 38 year old who 2 to 3 days ago start experiencing numbness tingling and weakness in his lower legs confined to from the knees down to his feet.  He denies any numbness in the upper part of his legs denies any saddle or perineal anesthesia denies any difficulty with bowel and bladder.  He does report back pain.  No particular inciting event.  Past Medical History:  Diagnosis Date  . ADHD (attention deficit hyperactivity disorder) 12/31/2011  . Allergy   . Asthma    childhood  . Back pain     Past Surgical History:  Procedure Laterality Date  . tubes in ears    . TYMPANOSTOMY TUBE PLACEMENT      Family History  Problem Relation Age of Onset  . Heart disease Mother   . Stroke Mother   . Hypertension Mother   . Diabetes Mother   . Hypertension Father   . Diabetes Father   . Hypertension Maternal Aunt   . Heart disease Maternal Grandmother   . Cancer Maternal Grandfather        lung CA    Social History:  reports that he has been smoking. He has been smoking about 1.00 pack per day. He has never used smokeless tobacco. He reports current alcohol use. He reports current drug use. Drug: Cocaine.  Allergies:  Allergies  Allergen Reactions  . Penicillin G Anaphylaxis    Airway swelling Per pt: he thinks he is allergic    Medications: I have reviewed the patient's current medications.  Results for orders placed or performed during the hospital encounter of 12/04/18 (from the past 48 hour(s))  CBC     Status: None   Collection Time: 12/04/18 10:41 PM  Result Value Ref Range   WBC 8.8 4.0 - 10.5 K/uL   RBC 5.09 4.22 - 5.81 MIL/uL   Hemoglobin 15.2 13.0 - 17.0 g/dL   HCT 91.446.5 78.239.0 - 95.652.0 %   MCV 91.4 80.0 - 100.0 fL   MCH 29.9 26.0 - 34.0 pg   MCHC 32.7 30.0 - 36.0 g/dL   RDW 21.312.8 08.611.5 - 57.815.5 %   Platelets 303 150 - 400 K/uL    nRBC 0.0 0.0 - 0.2 %    Comment: Performed at [facility], [address] [place], [place] [id number]  I-stat chem 8, ED (not at [facility] or [facility])     Status: Abnormal   Collection Time: 12/04/18 11:05 PM  Result Value Ref Range   Sodium 137 135 - 145 mmol/L   Potassium 4.4 3.5 - 5.1 mmol/L   Chloride 96 (L) 98 - 111 mmol/L   BUN 20 6 - 20 mg/dL   Creatinine, Ser 9.520.90 0.61 - 1.24 mg/dL   Glucose, Bld 841150 (H) 70 - 99 mg/dL   Calcium, Ion 3.241.17 4.011.15 - 1.40 mmol/L   TCO2 31 22 - 32 mmol/L   Hemoglobin 16.0 13.0 - 17.0 g/dL   HCT 02.747.0 25.339.0 - 66.452.0 %  Comprehensive metabolic panel     Status: Abnormal   Collection Time: 12/05/18  8:41 AM  Result Value Ref Range   Sodium 136 135 - 145 mmol/L   Potassium 3.9 3.5 - 5.1 mmol/L   Chloride 98 98 - 111 mmol/L   CO2 27 22 - 32 mmol/L   Glucose, Bld 132 (H) 70 - 99 mg/dL  BUN 14 6 - 20 mg/dL   Creatinine, Ser 8.11 0.61 - 1.24 mg/dL   Calcium 8.9 8.9 - 91.4 mg/dL   Total Protein 6.7 6.5 - 8.1 g/dL   Albumin 3.8 3.5 - 5.0 g/dL   AST 99 (H) 15 - 41 U/L   ALT 53 (H) 0 - 44 U/L   Alkaline Phosphatase 71 38 - 126 U/L   Total Bilirubin 0.6 0.3 - 1.2 mg/dL   GFR calc non Af Amer >60 >60 mL/min   GFR calc Af Amer >60 >60 mL/min   Anion gap 11 5 - 15    Comment: Performed at [facility], [address] [place], [place] [address]  SARS Coronavirus 2 [facility] order, Performed in [facility] hospital lab) Nasopharyngeal Nasopharyngeal Swab     Status: None   Collection Time: 12/05/18  8:43 AM   Specimen: Nasopharyngeal Swab  Result Value Ref Range   SARS Coronavirus 2 NEGATIVE NEGATIVE    Comment: (NOTE) If result is NEGATIVE SARS-CoV-2 target nucleic acids are NOT DETECTED. The SARS-CoV-2 RNA is generally detectable in upper and lower  respiratory specimens during the acute phase of infection. The lowest  concentration of SARS-CoV-2 viral copies this assay can detect is 250  copies / mL. A negative result does not preclude  SARS-CoV-2 infection  and should not be used as the sole basis for treatment or other  patient management decisions.  A negative result may occur with  improper specimen collection / handling, submission of specimen other  than nasopharyngeal swab, presence of viral mutation(s) within the  areas targeted by this assay, and inadequate number of viral copies  (<250 copies / mL). A negative result must be combined with clinical  observations, patient history, and epidemiological information. If result is POSITIVE SARS-CoV-2 target nucleic acids are DETECTED. The SARS-CoV-2 RNA is generally detectable in upper and lower  respiratory specimens dur ing the acute phase of infection.  Positive  results are indicative of active infection with SARS-CoV-2.  Clinical  correlation with patient history and other diagnostic information is  necessary to determine patient infection status.  Positive results do  not rule out bacterial infection or co-infection with other viruses. If result is PRESUMPTIVE POSTIVE SARS-CoV-2 nucleic acids MAY BE PRESENT.   A presumptive positive result was obtained on the submitted specimen  and confirmed on repeat testing.  While 2019 novel coronavirus  (SARS-CoV-2) nucleic acids may be present in the submitted sample  additional confirmatory testing may be necessary for epidemiological  and / or clinical management purposes  to differentiate between  SARS-CoV-2 and other Sarbecovirus currently known to infect humans.  If clinically indicated additional testing with an alternate test  methodology [phone]) is advised. The SARS-CoV-2 RNA is generally  detectable in upper and lower respiratory sp ecimens during the acute  phase of infection. The expected result is Negative. Fact Sheet for Patients:  BoilerBrush.com.cy Fact Sheet for Healthcare Providers: https://pope.com/ This test is not yet approved or cleared by the  [place] FDA and has been authorized for detection and/or diagnosis of SARS-CoV-2 by FDA under an Emergency Use Authorization (EUA).  This EUA will remain in effect (meaning this test can be used) for the duration of the COVID-19 declaration under Section 564(b)(1) of the Act, 21 U.S.C. section 360bbb-3(b)(1), unless the authorization is terminated or revoked sooner. Performed at [facility], [address] [place], [place] [address]   POCT I-Stat EG7  Status: Abnormal   Collection Time: 12/05/18  9:40 AM  Result Value Ref Range   pH, Ven 7.433 (H) 7.250 - 7.430   pCO2, Ven 44.1 44.0 - 60.0 mmHg   pO2, Ven 66.0 (H) 32.0 - 45.0 mmHg   Bicarbonate 29.5 (H) 20.0 - 28.0 mmol/L   TCO2 31 22 - 32 mmol/L   O2 Saturation 93.0 %   Acid-Base Excess 4.0 (H) 0.0 - 2.0 mmol/L   Sodium 139 135 - 145 mmol/L   Potassium 4.8 3.5 - 5.1 mmol/L   Calcium, Ion 1.02 (L) 1.15 - 1.40 mmol/L   HCT 40.0 39.0 - 52.0 %   Hemoglobin 13.6 13.0 - 17.0 g/dL   Patient temperature HIDE    Sample type VENOUS   Urinalysis, Routine w reflex microscopic     Status: Abnormal   Collection Time: 12/05/18 12:45 PM  Result Value Ref Range   Color, Urine YELLOW YELLOW   APPearance CLEAR CLEAR   Specific Gravity, Urine 1.030 1.005 - 1.030   pH 5.0 5.0 - 8.0   Glucose, UA 50 (A) NEGATIVE mg/dL   Hgb urine dipstick NEGATIVE NEGATIVE   Bilirubin Urine NEGATIVE NEGATIVE   Ketones, ur 5 (A) NEGATIVE mg/dL   Protein, ur NEGATIVE NEGATIVE mg/dL   Nitrite NEGATIVE NEGATIVE   Leukocytes,Ua NEGATIVE NEGATIVE    Comment: Performed at [facility], [address] [place], [place] [address]  Sedimentation rate     Status: None   Collection Time: 12/06/18  2:16 AM  Result Value Ref Range   Sed Rate 6 0 - 16 mm/hr    Comment: Performed at [facility], [address] [place], [place] [address]  C-reactive protein     Status: None   Collection Time: 12/06/18  2:16 AM  Result Value Ref Range   CRP  <0.8 <1.0 mg/dL    Comment: Performed at [facility], [address] [place], [place] [address]    Dg Chest 2 View  Result Date: 12/05/2018 CLINICAL DATA:  Shortness of breath and wheezing. EXAM: CHEST - 2 VIEW COMPARISON:  Chest x-ray dated May 30, 2017. FINDINGS: The heart size and mediastinal contours are within normal limits. Normal pulmonary vascularity. The lungs are hyperinflated. No focal consolidation, pleural effusion, or pneumothorax. No acute osseous abnormality. IMPRESSION: 1.  No active cardiopulmonary disease. 2. COPD. Electronically Signed   By: Obie Dredge M.D.   On: 12/05/2018 11:32   Mr Cervical Spine Wo Contrast  Result Date: 12/05/2018 CLINICAL DATA:  Bilateral lower extremity weakness and numbness for 2 days. EXAM: MRI CERVICAL AND THORACIC SPINE WITHOUT CONTRAST TECHNIQUE: Multiplanar and multiecho pulse sequences of the cervical spine, to include the craniocervical junction and cervicothoracic junction, and the thoracic spine, were obtained without intravenous contrast. COMPARISON:  Chest CTA 01/20/2017.  Bifemoral runoff CT 12/05/2018. FINDINGS: Despite efforts by the technologist and patient, mild motion artifact is present on today's exam and could not be eliminated. This reduces exam sensitivity and specificity. Patient terminated the examination prior to its completion. No axial imaging through the thoracic spine was obtained. No post-contrast imaging or lumbar spine imaging was obtained. MRI CERVICAL SPINE FINDINGS Alignment: Normal. Vertebrae: No acute or suspicious osseous findings. Cord: Normal in signal and caliber. Posterior Fossa, vertebral arteries, paraspinal tissues: Visualized portions of the posterior fossa and paraspinal soft tissues appear unremarkable. Bilateral vertebral artery flow voids. Disc levels: Disc space evaluation is limited by motion on the axial images. There is  no evidence of discitis or osteomyelitis. The disc heights are  well-maintained. No disc herniation, spinal stenosis or nerve root encroachment. MRI THORACIC SPINE FINDINGS Alignment:  Normal. Vertebrae: No evidence of acute fracture or focal marrow lesion. There is no evidence of discitis or osteomyelitis. Cord: The thoracic cord is normal in signal and caliber. Conus medullaris extends to the upper L1 level. Paraspinal and other soft tissues: No significant paraspinal findings on sagittal imaging. Disc levels: Thoracic disc valuation limited by the lack of axial images. The sagittal images demonstrate a small central disc protrusion at T2-3 without cord deformity or foraminal compromise. At T8-9, there is a right paracentral disc extrusion with cephalad extension of disc material behind the T8 vertebral body. This could be symptomatic, although does not deform the cord or significantly narrow the right foramen. There is a small central disc protrusion at T11 and a shallow right paracentral disc protrusion at T12-L1. No significant foraminal narrowing or nerve root encroachment identified. IMPRESSION: 1. Examination is limited. There is motion on the axial images through the cervical spine. Patient was unable to complete the axial imaging through the thoracic spine, in the lumbar spine was not imaged. 2. No significant findings in the cervical spine. 3. Several thoracic disc herniations are present, largest at T8-9 where there is right paracentral disc extrusion with cephalad extension of disc material. No cord deformity, foraminal compromise or nerve root encroachment identified. 4. No evidence of discitis or osteomyelitis. Electronically Signed   By: Richardean Sale M.D.   On: 12/05/2018 15:09   Mr Thoracic Spine Wo Contrast  Result Date: 12/05/2018 CLINICAL DATA:  Bilateral lower extremity weakness and numbness for 2 days. EXAM: MRI CERVICAL AND THORACIC SPINE WITHOUT CONTRAST TECHNIQUE: Multiplanar and multiecho pulse sequences of the cervical spine, to include the  craniocervical junction and cervicothoracic junction, and the thoracic spine, were obtained without intravenous contrast. COMPARISON:  Chest CTA 01/20/2017.  Bifemoral runoff CT 12/05/2018. FINDINGS: Despite efforts by the technologist and patient, mild motion artifact is present on today's exam and could not be eliminated. This reduces exam sensitivity and specificity. Patient terminated the examination prior to its completion. No axial imaging through the thoracic spine was obtained. No post-contrast imaging or lumbar spine imaging was obtained. MRI CERVICAL SPINE FINDINGS Alignment: Normal. Vertebrae: No acute or suspicious osseous findings. Cord: Normal in signal and caliber. Posterior Fossa, vertebral arteries, paraspinal tissues: Visualized portions of the posterior fossa and paraspinal soft tissues appear unremarkable. Bilateral vertebral artery flow voids. Disc levels: Disc space evaluation is limited by motion on the axial images. There is no evidence of discitis or osteomyelitis. The disc heights are well-maintained. No disc herniation, spinal stenosis or nerve root encroachment. MRI THORACIC SPINE FINDINGS Alignment:  Normal. Vertebrae: No evidence of acute fracture or focal marrow lesion. There is no evidence of discitis or osteomyelitis. Cord: The thoracic cord is normal in signal and caliber. Conus medullaris extends to the upper L1 level. Paraspinal and other soft tissues: No significant paraspinal findings on sagittal imaging. Disc levels: Thoracic disc valuation limited by the lack of axial images. The sagittal images demonstrate a small central disc protrusion at T2-3 without cord deformity or foraminal compromise. At T8-9, there is a right paracentral disc extrusion with cephalad extension of disc material behind the T8 vertebral body. This could be symptomatic, although does not deform the cord or significantly narrow the right foramen. There is a small central disc protrusion at T11 and a  shallow right paracentral disc  protrusion at T12-L1. No significant foraminal narrowing or nerve root encroachment identified. IMPRESSION: 1. Examination is limited. There is motion on the axial images through the cervical spine. Patient was unable to complete the axial imaging through the thoracic spine, in the lumbar spine was not imaged. 2. No significant findings in the cervical spine. 3. Several thoracic disc herniations are present, largest at T8-9 where there is right paracentral disc extrusion with cephalad extension of disc material. No cord deformity, foraminal compromise or nerve root encroachment identified. 4. No evidence of discitis or osteomyelitis. Electronically Signed   By: Carey BullocksWilliam  Veazey M.D.   On: 12/05/2018 15:09   Ct Angio Aortobifemoral W And/or Wo Contrast  Result Date: 12/05/2018 CLINICAL DATA:  38 year old male with bilateral lower extremity weakness and decreased pulses worse on the right than the left EXAM: CT ANGIOGRAPHY OF ABDOMINAL AORTA WITH ILIOFEMORAL RUNOFF TECHNIQUE: Multidetector CT imaging of the abdomen, pelvis and lower extremities was performed using the standard protocol during bolus administration of intravenous contrast. Multiplanar CT image reconstructions and MIPs were obtained to evaluate the vascular anatomy. CONTRAST:  100mL OMNIPAQUE IOHEXOL 350 MG/ML SOLN COMPARISON:  Prior CT abdomen and pelvis 05/19/2005 FINDINGS: VASCULAR Aorta: Normal caliber aorta without aneurysm, dissection, vasculitis or significant stenosis. Celiac: Patent without evidence of aneurysm, dissection, vasculitis or significant stenosis. SMA: Patent without evidence of aneurysm, dissection, vasculitis or significant stenosis. Renals: Both renal arteries are patent without evidence of aneurysm, dissection, vasculitis, fibromuscular dysplasia or significant stenosis. IMA: Patent without evidence of aneurysm, dissection, vasculitis or significant stenosis. RIGHT Lower Extremity Inflow: Common,  internal and external iliac arteries are patent without evidence of aneurysm, dissection, vasculitis or significant stenosis. Outflow: Common, superficial and profunda femoral arteries and the popliteal artery are patent without evidence of aneurysm, dissection, vasculitis or significant stenosis. Runoff: Patent three vessel runoff to the ankle. LEFT Lower Extremity Inflow: Common, internal and external iliac arteries are patent without evidence of aneurysm, dissection, vasculitis or significant stenosis. Outflow: Common, superficial and profunda femoral arteries and the popliteal artery are patent without evidence of aneurysm, dissection, vasculitis or significant stenosis. Runoff: Patent three vessel runoff to the ankle. Veins: No obvious venous abnormality within the limitations of this arterial phase study. Review of the MIP images confirms the above findings. NON-VASCULAR Lower chest: The lung bases are clear. Visualized cardiac structures are within normal limits for size. No pericardial effusion. Unremarkable visualized distal thoracic esophagus. Hepatobiliary: No focal liver abnormality is seen. No gallstones, gallbladder wall thickening, or biliary dilatation. Pancreas: Unremarkable. No pancreatic ductal dilatation or surrounding inflammatory changes. Spleen: Normal in size without focal abnormality. Adrenals/Urinary Tract: Adrenal glands are unremarkable. Kidneys are normal, without renal calculi, focal lesion, or hydronephrosis. Bladder is unremarkable. Stomach/Bowel: Stomach is within normal limits. Appendix appears normal. No evidence of bowel wall thickening, distention, or inflammatory changes. Lymphatic: No suspicious lymphadenopathy. Reproductive: Prostate is unremarkable. Other: No evidence of abdominal wall hernia or ascites. The bladder is full. Musculoskeletal: Lower lumbar degenerative disc disease with broad-based disc bulges at L4-L5 and L5-S1. IMPRESSION: VASCULAR 1. Normal CTA abdomen and  pelvis with bilateral lower extremity runoff. NON-VASCULAR 1. Lower lumbar degenerative disc disease at L4-L5 and L5-S1. Electronically Signed   By: Malachy MoanHeath  McCullough M.D.   On: 12/05/2018 11:00   [place] Abdomen Limited Ruq  Result Date: 12/06/2018 CLINICAL DATA:  Elevated liver function tests. EXAM: ULTRASOUND ABDOMEN LIMITED RIGHT UPPER QUADRANT COMPARISON:  Abdominal ultrasound 05/08/2007. CT angiogram abdomen and pelvis 12/05/2018. FINDINGS: Gallbladder: No gallstones or  wall thickening visualized. No sonographic Murphy sign noted by sonographer. Common bile duct: Diameter: 0.3 cm Liver: No focal lesion identified. Within normal limits in parenchymal echogenicity. Portal vein is patent on color Doppler imaging with normal direction of blood flow towards the liver. Other: None. IMPRESSION: Negative exam. Electronically Signed   By: Drusilla Kanner M.D.   On: 12/06/2018 09:40    Review of Systems  Neurological: Positive for sensory change and focal weakness.   Blood pressure 124/80, pulse (!) 59, temperature 98.2 F (36.8 C), resp. rate 16, height 6' (1.829 m), weight 70 kg, SpO2 100 %. Physical Exam  Neurological: He is alert. GCS eye subscore is 4. GCS verbal subscore is 5. GCS motor subscore is 6.  Patient was asleep but easily arousable proximal lower extremity strength was 5 out of 5 iliopsoas, quads, hamstrings, he has bilateral complete foot drops and dorsiflexion was 0 out of 5 plantarflexion 1-2 out of 5 sensation is decreased in the knees down.  He denies any sensory loss proximally in his legs denies any saddle anesthesia.    Assessment/Plan: 38 year old presents with back pain bilateral foot drops however does not report any saddle anesthesia or difficulty with bowel bladder does not present a classic picture for cauda equina he does have diffuse sensory loss from the knees down as well as complete foot drops.  Cervical and thoracic MRI were unremarkable and noncontributory patient was  not able to get the lumbar MRI certainly a lumbar MRI is mandatory to rule out his spine as a source of this although his picture is not clear-cut.  He does have some back pain but he does not have a lot of tenderness he does not present a picture of someone with a abscess or discitis.  And the weakness and numbness in his legs seem to follow both L4-L5 and S1 distribution.  But unusually there is no involvement of the posterior lateral thigh.  Will await lumbar MRI for final determination.  Asja Frommer P 12/06/2018, 12:34 PM

## 2018-12-06 NOTE — Consult Note (Signed)
Requesting Physician: Dr. Rathore/Dr. Freida BusmanAllen   Chief Complaint: Bilateral lower extremity weakness, " I can't walk"  History obtained from: Patient and Chart    HPI:                                                                                                                                       Craig MoorRobert D Woodward is a 38 y.o. male with past medical history of substance abuse (heroin), ADHD, asthma, back pain presents to the emergency department for bilateral lower extremity weakness into 2 days.  [facility] on 9/3 around 10:30 PM.  He states that he was last normal on 9/ 3. He was sitting on the sofa when suddenly he went to get up and could not.  He also complains of numbness/tingling over both legs throughout as well as severe back pain.  He does have a history of using heroin by snorting and use the last time the day before.  Since his admission, he has underwent CTA of the abdomen as well as femoral runoff as there is initial concern of reduced pulses in both feet.  CTA was negative for any arterial occlusions.  An MRI CT and L-spine was also ordered after discussion with neurology, due to suspicion of myelopathy.  Patient underwent a MRI C-spine and limited T-spine, however could not tolerate L-spine.  And reattempt to obtain MRI L-spine was made however patient did not tolerate it.  Neurology was consulted for further recommendations.   Past Medical History:  Diagnosis Date  . ADHD (attention deficit hyperactivity disorder) 12/31/2011  . Allergy   . Asthma    childhood  . Back pain     Past Surgical History:  Procedure Laterality Date  . tubes in ears    . TYMPANOSTOMY TUBE PLACEMENT      Family History  Problem Relation Age of Onset  . Heart disease Mother   . Stroke Mother   . Hypertension Mother   . Diabetes Mother   . Hypertension Father   . Diabetes Father   . Hypertension Maternal Aunt   . Heart disease Maternal Grandmother   . Cancer Maternal  Grandfather        lung CA   Social History:  reports that he has been smoking. He has been smoking about 1.00 pack per day. He has never used smokeless tobacco. He reports current alcohol use. He reports current drug use. Drug: Cocaine.  Allergies:  Allergies  Allergen Reactions  . Penicillin G Anaphylaxis    Airway swelling Per pt: he thinks he is allergic    Medications:  I reviewed home medications   ROS:                                                                                                                                     14 systems reviewed and negative except above    Examination:                                                                                                      General: Appears well-developed  Psych: Affect appropriate to situation Eyes: No scleral injection HENT: No OP obstrucion Head: Normocephalic.  Cardiovascular: Normal rate and regular rhythm. Respiratory: Effort normal and breath sounds normal to anterior ascultation GI: Soft.  No distension. There is no tenderness.  Skin: WDI    Neurological Examination Mental Status: Alert, oriented, thought content appropriate.  Speech fluent without evidence of aphasia.  Able to follow 3 step commands without difficulty. Cranial Nerves: II: visual fields grossly normal, pupils equal, round, reactive to light and accommodation III,IV, VI: ptosis not present, extraocular muscles extra-ocular motions intact bilaterally V,VII: smile symmetric, facial light touch sensation normal bilaterally VIII: hearing normal bilaterally IX,X: gag reflex present XI: trapezius strength/neck flexion strength normal bilaterally XII: tongue strength normal  Motor: Right : Upper extremity    Left:     Upper extremity 5/5 deltoid       5/5 deltoid 5/5 tricep      5/5 tricep 5/5 biceps      5/5  biceps  5/5wrist flexion     5/5 wrist flexion 5/5 wrist extension     5/5 wrist extension 5/5 hand grip      5/5 hand grip  Lower extremity     Lower extremity 4+/5 hip flexion     5/5 hip flexion 5/5 hip extension                                                         5/5 hip extension 5/5 hip adductors     5/5 hip adductors 5/5 knee flexion                 5/5 knee flexion 5/5 knee extension                 5/5 knee extension 1/5 plantar flexion  1/5 plantar flexion 1/5 plantar extension     1/5 plantar extension Tone and bulk:normal tone throughout; no atrophy noted Sensory: Pinprick and light touch intact throughout, vibration and position sense also intact. Patient complains of subjective parasthesia and allodynia  Deep Tendon Reflexes:  Right: Upper Extremity   Left: Upper extremity   biceps (C-5 to C-6) 2/4   biceps (C-5 to C-6) 2/4 tricep (C7) 2/4    triceps (C7) 2/4 Brachioradialis (C6) 2/4  Brachioradialis (C6) 2/4  Lower Extremity Lower Extremity  quadriceps (L-2 to L-4) 3/4   quadriceps (L-2 to L-4) 3/4 Achilles (S1) 0/4   Achilles (S1) 0/4 Plantars: Right: downgoing   Left: downgoing Cerebellar: normal finger-to-nose, normal rapid alternating movements and normal heel-to-shin test normal gait and station     Lab Results: Basic Metabolic Panel: Recent Labs  Lab 12/04/18 2305 12/05/18 0841 12/05/18 0940  NA 137 136 139  K 4.4 3.9 4.8  CL 96* 98  --   CO2  --  27  --   GLUCOSE 150* 132*  --   BUN 20 14  --   CREATININE 0.90 0.74  --   CALCIUM  --  8.9  --     CBC: Recent Labs  Lab 12/04/18 2241 12/04/18 2305 12/05/18 0940  WBC 8.8  --   --   HGB 15.2 16.0 13.6  HCT 46.5 47.0 40.0  MCV 91.4  --   --   PLT 303  --   --     Coagulation Studies: No results for input(s): LABPROT, INR in the last 72 hours.  Imaging: Dg Chest 2 View  Result Date: 12/05/2018 CLINICAL DATA:  Shortness of breath and wheezing. EXAM: CHEST - 2 VIEW COMPARISON:   Chest x-ray dated May 30, 2017. FINDINGS: The heart size and mediastinal contours are within normal limits. Normal pulmonary vascularity. The lungs are hyperinflated. No focal consolidation, pleural effusion, or pneumothorax. No acute osseous abnormality. IMPRESSION: 1.  No active cardiopulmonary disease. 2. COPD. Electronically Signed   By: Obie Dredge M.D.   On: 12/05/2018 11:32   Mr Cervical Spine Wo Contrast  Result Date: 12/05/2018 CLINICAL DATA:  Bilateral lower extremity weakness and numbness for 2 days. EXAM: MRI CERVICAL AND THORACIC SPINE WITHOUT CONTRAST TECHNIQUE: Multiplanar and multiecho pulse sequences of the cervical spine, to include the craniocervical junction and cervicothoracic junction, and the thoracic spine, were obtained without intravenous contrast. COMPARISON:  Chest CTA 01/20/2017.  Bifemoral runoff CT 12/05/2018. FINDINGS: Despite efforts by the technologist and patient, mild motion artifact is present on today's exam and could not be eliminated. This reduces exam sensitivity and specificity. Patient terminated the examination prior to its completion. No axial imaging through the thoracic spine was obtained. No post-contrast imaging or lumbar spine imaging was obtained. MRI CERVICAL SPINE FINDINGS Alignment: Normal. Vertebrae: No acute or suspicious osseous findings. Cord: Normal in signal and caliber. Posterior Fossa, vertebral arteries, paraspinal tissues: Visualized portions of the posterior fossa and paraspinal soft tissues appear unremarkable. Bilateral vertebral artery flow voids. Disc levels: Disc space evaluation is limited by motion on the axial images. There is no evidence of discitis or osteomyelitis. The disc heights are well-maintained. No disc herniation, spinal stenosis or nerve root encroachment. MRI THORACIC SPINE FINDINGS Alignment:  Normal. Vertebrae: No evidence of acute fracture or focal marrow lesion. There is no evidence of discitis or osteomyelitis.  Cord: The thoracic cord is normal in signal and caliber. Conus medullaris extends to the upper L1 level.  Paraspinal and other soft tissues: No significant paraspinal findings on sagittal imaging. Disc levels: Thoracic disc valuation limited by the lack of axial images. The sagittal images demonstrate a small central disc protrusion at T2-3 without cord deformity or foraminal compromise. At T8-9, there is a right paracentral disc extrusion with cephalad extension of disc material behind the T8 vertebral body. This could be symptomatic, although does not deform the cord or significantly narrow the right foramen. There is a small central disc protrusion at T11 and a shallow right paracentral disc protrusion at T12-L1. No significant foraminal narrowing or nerve root encroachment identified. IMPRESSION: 1. Examination is limited. There is motion on the axial images through the cervical spine. Patient was unable to complete the axial imaging through the thoracic spine, in the lumbar spine was not imaged. 2. No significant findings in the cervical spine. 3. Several thoracic disc herniations are present, largest at T8-9 where there is right paracentral disc extrusion with cephalad extension of disc material. No cord deformity, foraminal compromise or nerve root encroachment identified. 4. No evidence of discitis or osteomyelitis. Electronically Signed   By: Carey BullocksWilliam  Veazey M.D.   On: 12/05/2018 15:09   Mr Thoracic Spine Wo Contrast  Result Date: 12/05/2018 CLINICAL DATA:  Bilateral lower extremity weakness and numbness for 2 days. EXAM: MRI CERVICAL AND THORACIC SPINE WITHOUT CONTRAST TECHNIQUE: Multiplanar and multiecho pulse sequences of the cervical spine, to include the craniocervical junction and cervicothoracic junction, and the thoracic spine, were obtained without intravenous contrast. COMPARISON:  Chest CTA 01/20/2017.  Bifemoral runoff CT 12/05/2018. FINDINGS: Despite efforts by the technologist and patient,  mild motion artifact is present on today's exam and could not be eliminated. This reduces exam sensitivity and specificity. Patient terminated the examination prior to its completion. No axial imaging through the thoracic spine was obtained. No post-contrast imaging or lumbar spine imaging was obtained. MRI CERVICAL SPINE FINDINGS Alignment: Normal. Vertebrae: No acute or suspicious osseous findings. Cord: Normal in signal and caliber. Posterior Fossa, vertebral arteries, paraspinal tissues: Visualized portions of the posterior fossa and paraspinal soft tissues appear unremarkable. Bilateral vertebral artery flow voids. Disc levels: Disc space evaluation is limited by motion on the axial images. There is no evidence of discitis or osteomyelitis. The disc heights are well-maintained. No disc herniation, spinal stenosis or nerve root encroachment. MRI THORACIC SPINE FINDINGS Alignment:  Normal. Vertebrae: No evidence of acute fracture or focal marrow lesion. There is no evidence of discitis or osteomyelitis. Cord: The thoracic cord is normal in signal and caliber. Conus medullaris extends to the upper L1 level. Paraspinal and other soft tissues: No significant paraspinal findings on sagittal imaging. Disc levels: Thoracic disc valuation limited by the lack of axial images. The sagittal images demonstrate a small central disc protrusion at T2-3 without cord deformity or foraminal compromise. At T8-9, there is a right paracentral disc extrusion with cephalad extension of disc material behind the T8 vertebral body. This could be symptomatic, although does not deform the cord or significantly narrow the right foramen. There is a small central disc protrusion at T11 and a shallow right paracentral disc protrusion at T12-L1. No significant foraminal narrowing or nerve root encroachment identified. IMPRESSION: 1. Examination is limited. There is motion on the axial images through the cervical spine. Patient was unable to  complete the axial imaging through the thoracic spine, in the lumbar spine was not imaged. 2. No significant findings in the cervical spine. 3. Several thoracic disc herniations are present, largest at  T8-9 where there is right paracentral disc extrusion with cephalad extension of disc material. No cord deformity, foraminal compromise or nerve root encroachment identified. 4. No evidence of discitis or osteomyelitis. Electronically Signed   By: Richardean Sale M.D.   On: 12/05/2018 15:09   Ct Angio Aortobifemoral W And/or Wo Contrast  Result Date: 12/05/2018 CLINICAL DATA:  38 year old male with bilateral lower extremity weakness and decreased pulses worse on the right than the left EXAM: CT ANGIOGRAPHY OF ABDOMINAL AORTA WITH ILIOFEMORAL RUNOFF TECHNIQUE: Multidetector CT imaging of the abdomen, pelvis and lower extremities was performed using the standard protocol during bolus administration of intravenous contrast. Multiplanar CT image reconstructions and MIPs were obtained to evaluate the vascular anatomy. CONTRAST:  135mL OMNIPAQUE IOHEXOL 350 MG/ML SOLN COMPARISON:  Prior CT abdomen and pelvis 05/19/2005 FINDINGS: VASCULAR Aorta: Normal caliber aorta without aneurysm, dissection, vasculitis or significant stenosis. Celiac: Patent without evidence of aneurysm, dissection, vasculitis or significant stenosis. SMA: Patent without evidence of aneurysm, dissection, vasculitis or significant stenosis. Renals: Both renal arteries are patent without evidence of aneurysm, dissection, vasculitis, fibromuscular dysplasia or significant stenosis. IMA: Patent without evidence of aneurysm, dissection, vasculitis or significant stenosis. RIGHT Lower Extremity Inflow: Common, internal and external iliac arteries are patent without evidence of aneurysm, dissection, vasculitis or significant stenosis. Outflow: Common, superficial and profunda femoral arteries and the popliteal artery are patent without evidence of aneurysm,  dissection, vasculitis or significant stenosis. Runoff: Patent three vessel runoff to the ankle. LEFT Lower Extremity Inflow: Common, internal and external iliac arteries are patent without evidence of aneurysm, dissection, vasculitis or significant stenosis. Outflow: Common, superficial and profunda femoral arteries and the popliteal artery are patent without evidence of aneurysm, dissection, vasculitis or significant stenosis. Runoff: Patent three vessel runoff to the ankle. Veins: No obvious venous abnormality within the limitations of this arterial phase study. Review of the MIP images confirms the above findings. NON-VASCULAR Lower chest: The lung bases are clear. Visualized cardiac structures are within normal limits for size. No pericardial effusion. Unremarkable visualized distal thoracic esophagus. Hepatobiliary: No focal liver abnormality is seen. No gallstones, gallbladder wall thickening, or biliary dilatation. Pancreas: Unremarkable. No pancreatic ductal dilatation or surrounding inflammatory changes. Spleen: Normal in size without focal abnormality. Adrenals/Urinary Tract: Adrenal glands are unremarkable. Kidneys are normal, without renal calculi, focal lesion, or hydronephrosis. Bladder is unremarkable. Stomach/Bowel: Stomach is within normal limits. Appendix appears normal. No evidence of bowel wall thickening, distention, or inflammatory changes. Lymphatic: No suspicious lymphadenopathy. Reproductive: Prostate is unremarkable. Other: No evidence of abdominal wall hernia or ascites. The bladder is full. Musculoskeletal: Lower lumbar degenerative disc disease with broad-based disc bulges at L4-L5 and L5-S1. IMPRESSION: VASCULAR 1. Normal CTA abdomen and pelvis with bilateral lower extremity runoff. NON-VASCULAR 1. Lower lumbar degenerative disc disease at L4-L5 and L5-S1. Electronically Signed   By: Jacqulynn Cadet M.D.   On: 12/05/2018 11:00     ASSESSMENT AND PLAN  38 year old male with  history of heroin abuse (snorts) presents with sudden onset back pain and bilateral foot drop, lower extremity weakness with bilateral lower extremity numbness tingling.  Differentials include conus medullaris, however he denies significant  bladder incontinence but does complain of pain with brisk patellar reflexes, would typically not see absent ankle reflexes though.  Cauda equina also possibility but patient does not have as much weakness as you would expect and typically we do not see brisk patellar reflexes.  Other possibility includes AIDP which would be unusual given sudden onset of presentation.  I do think MRI L-spine will be crucial in diagnosis.  Also would appreciate assistance of neurosurgery to evaluate this patient with sudden bilateral lower extremity weakness.    Impression Bilateral foot droop with hip flexion weakness (unclear if it is due to limitation from pain) D/D Consider Conus medullaris vs cauda equina syndrome/Spinal Epidural Abscess (given h/ of heroin abuse) vs spinal stenosis with L5- S1 radiculopathy vs bilateral peroneal neuropathy.    Recommendations Neurosurgery consult  Pain meds before repeat MRI exam, also consider Ativan if needed Blood cultures Consider empiric steroids, obtain blood cultures If MRI negative, will need LP, low suspicion for AIDP however   Sushanth Aroor [facility] Pager Number [phone]

## 2018-12-06 NOTE — Progress Notes (Signed)
PROGRESS NOTE  Craig Woodward NTZ:[id number] DOB: Sep 01, 1980 DOA: 12/04/2018 PCP: Patient, No Pcp Per  HPI/Recap of past 24 hours:  Craig Woodward is a 38 y.o. male with medical history significant of heroin abuse, ADHD, allergy, asthma, back pain presenting to the hospital for evaluation of bilateral leg weakness.  Patient states his legs are weak for the past 2 days from the ankle down and he has not been able to feel his feet.  He has not been able to walk.  Denies saddle anesthesia or bladder/fecal incontinence.  Denies any fevers or chills.  States he snorts heroin, last used 3 days ago.  Patient initially denied back pain but then later stated he had some lower back pain while in the MRI machine.  Denies injury to his back or legs.  No additional history could be obtained from him.  ED Course: Hemodynamically stable.  Patient has been in the ED since 12/04/2018 p.m.  CBC done yesterday showing no leukocytosis.  CMP showing elevated transaminases (AST 99, ALT 53).  Alk phos and T bili normal.  SARS-CoV-2 test negative.  ESR pending.  Blood culture x2 pending.  Noted to have cool feet with absent pedal pulses on the right, diminished on the left.  CTA aortobifemoral negative for occlusion.  Given history of drug abuse, MRI of cervical, thoracic, and lumbar spine was ordered.  2 attempts were made to get MRI done but patient was not able to tolerate it.  The examination was limited and motion degraded.  Patient was unable to complete the axial imaging throughout the thoracic spine and the lumbar spine was not imaged. No significant findings seen in the cervical spine.  Several thoracic disc herniations, largest at T8-9 where there is right paracentral disc extrusion with cephalad extension of disc material.  No cord deformity, foraminal compromise or nerve root encroachment identified.  No evidence of discitis or osteomyelitis. Per ED provider, patient is able to perform straight leg raise but states  he has no strength when he stands up. Case was discussed with Dr. Lorraine Lax from neurology who will consult. Patient received a 500 cc fluid bolus. Other imaging done in the ED: Chest x-ray showing findings consistent with COPD and no active cardiopulmonary disease.  12/06/18: Patient seen and examined at bedside.  He is somnolent but easily arousable to voices.  Persistent foot drop on exam bilaterally.  Bedside RN reports patient states he is withdrawing.  Will obtain a stat UDS.  Last UDS from 08/31/2013 showed positive cocaine and THC.  Assessment/Plan: Principal Problem:   Foot drop, bilateral Active Problems:   Anxiety   Abnormal transaminases   Heroin abuse (HCC)   Asthma  Bilateral foot drop, unclear etiology Neurology has been consulted and following MRI ordered and pending If MRI is negative, will need LP per neurology  Concern for IV drug use Blood cultures x2 in process Currently afebrile with no leukocytosis Monitor fever curve and WBC  Polysubstance abuse including cocaine and THC Obtain UDS Polysubstance abuse cessation counseling when more alert Social worker consulted to assist with resources for polysubstance abuse cessation  Elevated LFTs Abdominal ultrasound unrevealing, no gallstones or wall thickening 10 to monitor CRP and sed rate negative  History of asthma Stable Continue albuterol as needed  Anxiety Continue Lexapro   DVT prophylaxis: Subcutaneous heparin Code Status: Full code Family Communication: No family available. Disposition Plan: Anticipate discharge in 1 to 2 days. Consults called: Neurosurgery, neurology    Objective: Vitals:  12/05/18 2318 12/06/18 0156 12/06/18 0200 12/06/18 0515  BP: 115/74 132/87 132/81 134/86  Pulse: 65 85 85 91  Resp: _0 Temp: 98.2 F (36.8 C) 98.6 F (37 C) 98.6 F (37 C) 98.2 F (36.8 C)  TempSrc: Oral Axillary Oral   SpO2: 94% 97% 97% 97%  Weight:   70 kg   Height:   6' (1.829 m)      Intake/Output Summary (Last 24 hours) at 12/06/2018 1146 Last data filed at 12/06/2018 0554 Gross per 24 hour  Intake 500 ml  Output 725 ml  Net -225 ml   Filed Weights   12/06/18 0200  Weight: 70 kg    Exam:   General: 37 y.o. year-old male well developed well nourished in no acute distress.  Somnolent; easily arouses.  Cardiovascular: Regular rate and rhythm with no rubs or gallops.  No thyromegaly or JVD noted.    Respiratory: Clear to auscultation with no wheezes or rales. Good inspiratory effort.  Abdomen: Soft nontender nondistended with normal bowel sounds x4 quadrants.  Musculoskeletal: No lower extremity edema. 2/4 pulses in all 4 extremities.  Psychiatry: Unable to assess mood due to somnolence.   Data Reviewed: CBC: Recent Labs  Lab 12/04/18 2241 12/04/18 2305 12/05/18 0940  WBC 8.8  --   --   HGB 15.2 16.0 13.6  HCT 46.5 47.0 40.0  MCV 91.4  --   --   PLT 303  --   --    Basic Metabolic Panel: Recent Labs  Lab 12/04/18 2305 12/05/18 0841 12/05/18 0940  NA 137 136 139  K 4.4 3.9 4.8  CL 96* 98  --   CO2  --  27  --   GLUCOSE 150* 132*  --   BUN 20 14  --   CREATININE 0.90 0.74  --   CALCIUM  --  8.9  --    GFR: Estimated Creatinine Clearance: 126.4 mL/min (by C-G formula based on SCr of 0.74 mg/dL). Liver Function Tests: Recent Labs  Lab 12/05/18 0841  AST 99*  ALT 53*  ALKPHOS 71  BILITOT 0.6  PROT 6.7  ALBUMIN 3.8   No results for input(s): LIPASE, AMYLASE in the last 168 hours. No results for input(s): AMMONIA in the last 168 hours. Coagulation Profile: No results for input(s): INR, PROTIME in the last 168 hours. Cardiac Enzymes: No results for input(s): CKTOTAL, CKMB, CKMBINDEX, TROPONINI in the last 168 hours. BNP (last 3 results) No results for input(s): PROBNP in the last 8760 hours. HbA1C: No results for input(s): HGBA1C in the last 72 hours. CBG: No results for input(s): GLUCAP in the last 168 hours. Lipid  Profile: No results for input(s): CHOL, HDL, LDLCALC, TRIG, CHOLHDL, LDLDIRECT in the last 72 hours. Thyroid Function Tests: No results for input(s): TSH, T4TOTAL, FREET4, T3FREE, THYROIDAB in the last 72 hours. Anemia Panel: No results for input(s): VITAMINB12, FOLATE, FERRITIN, TIBC, IRON, RETICCTPCT in the last 72 hours. Urine analysis:    Component Value Date/Time   COLORURINE YELLOW 12/05/2018 [place] 12/05/2018 1245   LABSPEC 1.030 12/05/2018 1245   PHURINE 5.0 12/05/2018 1245   GLUCOSEU 50 (A) 12/05/2018 1245   HGBUR NEGATIVE 12/05/2018 1245   BILIRUBINUR NEGATIVE 12/05/2018 1245   KETONESUR 5 (A) 12/05/2018 1245   PROTEINUR NEGATIVE 12/05/2018 1245   NITRITE NEGATIVE 12/05/2018 1245   LEUKOCYTESUR NEGATIVE 12/05/2018 1245   Sepsis Labs: _1 (procalcitonin:4,lacticidven:4)  ) Recent Results (from the past 240 hour(s))  SARS Coronavirus 2 [facility] order, Performed in [facility] hospital lab) Nasopharyngeal Nasopharyngeal Swab     Status: None   Collection Time: 12/05/18  8:43 AM   Specimen: Nasopharyngeal Swab  Result Value Ref Range Status   SARS Coronavirus 2 NEGATIVE NEGATIVE Final    Comment: (NOTE) If result is NEGATIVE SARS-CoV-2 target nucleic acids are NOT DETECTED. The SARS-CoV-2 RNA is generally detectable in upper and lower  respiratory specimens during the acute phase of infection. The lowest  concentration of SARS-CoV-2 viral copies this assay can detect is 250  copies / mL. A negative result does not preclude SARS-CoV-2 infection  and should not be used as the sole basis for treatment or other  patient management decisions.  A negative result may occur with  improper specimen collection / handling, submission of specimen other  than nasopharyngeal swab, presence of viral mutation(s) within the  areas targeted by this assay, and inadequate number of viral copies  (<250 copies / mL). A negative result must be combined with  clinical  observations, patient history, and epidemiological information. If result is POSITIVE SARS-CoV-2 target nucleic acids are DETECTED. The SARS-CoV-2 RNA is generally detectable in upper and lower  respiratory specimens dur ing the acute phase of infection.  Positive  results are indicative of active infection with SARS-CoV-2.  Clinical  correlation with patient history and other diagnostic information is  necessary to determine patient infection status.  Positive results do  not rule out bacterial infection or co-infection with other viruses. If result is PRESUMPTIVE POSTIVE SARS-CoV-2 nucleic acids MAY BE PRESENT.   A presumptive positive result was obtained on the submitted specimen  and confirmed on repeat testing.  While 2019 novel coronavirus  (SARS-CoV-2) nucleic acids may be present in the submitted sample  additional confirmatory testing may be necessary for epidemiological  and / or clinical management purposes  to differentiate between  SARS-CoV-2 and other Sarbecovirus currently known to infect humans.  If clinically indicated additional testing with an alternate test  methodology [phone]) is advised. The SARS-CoV-2 RNA is generally  detectable in upper and lower respiratory sp ecimens during the acute  phase of infection. The expected result is Negative. Fact Sheet for Patients:  StrictlyIdeas.no Fact Sheet for Healthcare Providers: BankingDealers.co.za This test is not yet approved or cleared by the [place] FDA and has been authorized for detection and/or diagnosis of SARS-CoV-2 by FDA under an Emergency Use Authorization (EUA).  This EUA will remain in effect (meaning this test can be used) for the duration of the COVID-19 declaration under Section 564(b)(1) of the Act, 21 U.S.C. section 360bbb-3(b)(1), unless the authorization is terminated or revoked sooner. Performed at [facility], [place]  [address] [place], [place] [address]       Studies: Mr Cervical Spine Wo Contrast  Result Date: 12/05/2018 CLINICAL DATA:  Bilateral lower extremity weakness and numbness for 2 days. EXAM: MRI CERVICAL AND THORACIC SPINE WITHOUT CONTRAST TECHNIQUE: Multiplanar and multiecho pulse sequences of the cervical spine, to include the craniocervical junction and cervicothoracic junction, and the thoracic spine, were obtained without intravenous contrast. COMPARISON:  Chest CTA 01/20/2017.  Bifemoral runoff CT 12/05/2018. FINDINGS: Despite efforts by the technologist and patient, mild motion artifact is present on today's exam and could not be eliminated. This reduces exam sensitivity and specificity. Patient terminated the examination prior to its completion. No axial imaging through the thoracic spine was obtained. No post-contrast imaging or lumbar spine imaging was obtained. MRI CERVICAL  SPINE FINDINGS Alignment: Normal. Vertebrae: No acute or suspicious osseous findings. Cord: Normal in signal and caliber. Posterior Fossa, vertebral arteries, paraspinal tissues: Visualized portions of the posterior fossa and paraspinal soft tissues appear unremarkable. Bilateral vertebral artery flow voids. Disc levels: Disc space evaluation is limited by motion on the axial images. There is no evidence of discitis or osteomyelitis. The disc heights are well-maintained. No disc herniation, spinal stenosis or nerve root encroachment. MRI THORACIC SPINE FINDINGS Alignment:  Normal. Vertebrae: No evidence of acute fracture or focal marrow lesion. There is no evidence of discitis or osteomyelitis. Cord: The thoracic cord is normal in signal and caliber. Conus medullaris extends to the upper L1 level. Paraspinal and other soft tissues: No significant paraspinal findings on sagittal imaging. Disc levels: Thoracic disc valuation limited by the lack of axial images. The sagittal images demonstrate a small central disc protrusion at T2-3  without cord deformity or foraminal compromise. At T8-9, there is a right paracentral disc extrusion with cephalad extension of disc material behind the T8 vertebral body. This could be symptomatic, although does not deform the cord or significantly narrow the right foramen. There is a small central disc protrusion at T11 and a shallow right paracentral disc protrusion at T12-L1. No significant foraminal narrowing or nerve root encroachment identified. IMPRESSION: 1. Examination is limited. There is motion on the axial images through the cervical spine. Patient was unable to complete the axial imaging through the thoracic spine, in the lumbar spine was not imaged. 2. No significant findings in the cervical spine. 3. Several thoracic disc herniations are present, largest at T8-9 where there is right paracentral disc extrusion with cephalad extension of disc material. No cord deformity, foraminal compromise or nerve root encroachment identified. 4. No evidence of discitis or osteomyelitis. Electronically Signed   By: Richardean Sale M.D.   On: 12/05/2018 15:09   Mr Thoracic Spine Wo Contrast  Result Date: 12/05/2018 CLINICAL DATA:  Bilateral lower extremity weakness and numbness for 2 days. EXAM: MRI CERVICAL AND THORACIC SPINE WITHOUT CONTRAST TECHNIQUE: Multiplanar and multiecho pulse sequences of the cervical spine, to include the craniocervical junction and cervicothoracic junction, and the thoracic spine, were obtained without intravenous contrast. COMPARISON:  Chest CTA 01/20/2017.  Bifemoral runoff CT 12/05/2018. FINDINGS: Despite efforts by the technologist and patient, mild motion artifact is present on today's exam and could not be eliminated. This reduces exam sensitivity and specificity. Patient terminated the examination prior to its completion. No axial imaging through the thoracic spine was obtained. No post-contrast imaging or lumbar spine imaging was obtained. MRI CERVICAL SPINE FINDINGS Alignment:  Normal. Vertebrae: No acute or suspicious osseous findings. Cord: Normal in signal and caliber. Posterior Fossa, vertebral arteries, paraspinal tissues: Visualized portions of the posterior fossa and paraspinal soft tissues appear unremarkable. Bilateral vertebral artery flow voids. Disc levels: Disc space evaluation is limited by motion on the axial images. There is no evidence of discitis or osteomyelitis. The disc heights are well-maintained. No disc herniation, spinal stenosis or nerve root encroachment. MRI THORACIC SPINE FINDINGS Alignment:  Normal. Vertebrae: No evidence of acute fracture or focal marrow lesion. There is no evidence of discitis or osteomyelitis. Cord: The thoracic cord is normal in signal and caliber. Conus medullaris extends to the upper L1 level. Paraspinal and other soft tissues: No significant paraspinal findings on sagittal imaging. Disc levels: Thoracic disc valuation limited by the lack of axial images. The sagittal images demonstrate a small central disc protrusion at T2-3 without cord deformity  or foraminal compromise. At T8-9, there is a right paracentral disc extrusion with cephalad extension of disc material behind the T8 vertebral body. This could be symptomatic, although does not deform the cord or significantly narrow the right foramen. There is a small central disc protrusion at T11 and a shallow right paracentral disc protrusion at T12-L1. No significant foraminal narrowing or nerve root encroachment identified. IMPRESSION: 1. Examination is limited. There is motion on the axial images through the cervical spine. Patient was unable to complete the axial imaging through the thoracic spine, in the lumbar spine was not imaged. 2. No significant findings in the cervical spine. 3. Several thoracic disc herniations are present, largest at T8-9 where there is right paracentral disc extrusion with cephalad extension of disc material. No cord deformity, foraminal compromise or nerve  root encroachment identified. 4. No evidence of discitis or osteomyelitis. Electronically Signed   By: Richardean Sale M.D.   On: 12/05/2018 15:09   US Abdomen Limited Ruq  Result Date: 12/06/2018 CLINICAL DATA:  Elevated liver function tests. EXAM: ULTRASOUND ABDOMEN LIMITED RIGHT UPPER QUADRANT COMPARISON:  Abdominal ultrasound 05/08/2007. CT angiogram abdomen and pelvis 12/05/2018. FINDINGS: Gallbladder: No gallstones or wall thickening visualized. No sonographic Murphy sign noted by sonographer. Common bile duct: Diameter: 0.3 cm Liver: No focal lesion identified. Within normal limits in parenchymal echogenicity. Portal vein is patent on color Doppler imaging with normal direction of blood flow towards the liver. Other: None. IMPRESSION: Negative exam. Electronically Signed   By: Inge Rise M.D.   On: 12/06/2018 09:40    Scheduled Meds:  diazepam  10 mg Oral Once   escitalopram  20 mg Oral Daily   heparin  5,000 Units Subcutaneous Q8H    Continuous Infusions:   LOS: 0 days     Kayleen Memos, MD [facility] Pager [phone]  If 7PM-7AM, please contact night-coverage [email or web address] Password [email or web address] 12/06/2018, 11:46 AM

## 2018-12-06 NOTE — Progress Notes (Signed)
Patient received to the floor via bed. Patient is alert and oriented x4. Vital signs are stable. Iv in place. Skin assessment done with another nurse. patient given instructions about call bell and phone. Bed in low position and call bell in reach.

## 2018-12-06 NOTE — Progress Notes (Signed)
NEURO HOSPITALIST PROGRESS NOTE   Subjective: Patient in  Bed  Asleep,NAD.  Exam: Vitals:   12/06/18 0200 12/06/18 0515  BP: 132/81 134/86  Pulse: 85 91  Resp: 18 17  Temp: 98.6 F (37 C) 98.2 F (36.8 C)  SpO2: 97% 97%    Physical Exam   HEENT-  Normocephalic, no lesions, without obvious abnormality.  Normal external eye and conjunctiva.   Cardiovascular- S, pulses palpable throughout   Lungs no excessive working breathing.  Saturations within normal limits on  RA Abdomen- All 4 quadrants palpated and nontender Extremities- Warm, dry and intact Musculoskeletal-no joint tenderness, deformity or swelling    Neuro:  Mental Status: Alert, oriented, thought content appropriate.  Speech fluent without evidence of aphasia.  Able to follow  commands without difficulty. Cranial Nerves: II:  Visual fields grossly normal,  III,IV, VI: ptosis not present, extra-ocular motions intact bilaterally pupils equal, round, reactive to light and accommodation V,VII: smile symmetric, facial light touch sensation normal bilaterally VIII: hearing normal bilaterally IX,X: uvula rises symmetrically XI: bilateral shoulder shrug XII: midline tongue extension Motor: Right :  Upper extremity                                              Left:     Upper extremity 5/5 deltoid                                                                   5/5 deltoid 5/5 tricep                                                                     5/5 tricep 5/5 biceps                                                                    5/5 biceps  5/5wrist flexion                                                            5/5 wrist flexion 5/5 wrist extension                                                      5/5  wrist extension 5/5 hand grip                                                               5/5 hand grip      Lower extremity                                                           Lower extremity 4+/5 hip flexion                                                            5/5 hip flexion 5/5 hip extension                                                         5/5 hip extension 5/5 hip adductors                                                        5/5 hip adductors 5/5 knee flexion                                                            5/5 knee flexion 5/5 knee extension                                                       5/5 knee extension 0/5 plantar flexion                                                       0/5 plantar flexion 1/5 plantar extension                                                  1/5 plantar extension Tone and bulk:normal tone throughout; no atrophy noted Sensory:  light touch intact throughout, bilaterally  Patient states that bilaterally he feels  a burning  sensation in his feet when plantars are  tested  Deep Tendon Reflexes: 2+ and symmetric biceps, patella and brachioradialis 0  Bilateral achilles Plantars: Right: downgoing   Left: downgoing Cerebellar: Normal FNF,  Gait: deferred    Medications:  Scheduled: . diazepam  10 mg Oral Once  . escitalopram  20 mg Oral Daily  . heparin  5,000 Units Subcutaneous Q8H   Continuous:  ZOX:[id number] **OR** acetaminophen, albuterol, traMADol  Pertinent Labs/Diagnostics:   Dg Chest 2 View  Result Date: 12/05/2018 CLINICAL DATA:  Shortness of breath and wheezing. EXAM: CHEST - 2 VIEW COMPARISON:  Chest x-ray dated May 30, 2017. FINDINGS: The heart size and mediastinal contours are within normal limits. Normal pulmonary vascularity. The lungs are hyperinflated. No focal consolidation, pleural effusion, or pneumothorax. No acute osseous abnormality. IMPRESSION: 1.  No active cardiopulmonary disease. 2. COPD. Electronically Signed   By: Obie Dredge M.D.   On: 12/05/2018 11:32   Mr Cervical Spine Wo Contrast  Result Date: 12/05/2018 CLINICAL DATA:  Bilateral lower extremity  weakness and numbness for 2 days. EXAM: MRI CERVICAL AND THORACIC SPINE WITHOUT CONTRAST TECHNIQUE: Multiplanar and multiecho pulse sequences of the cervical spine, to include the craniocervical junction and cervicothoracic junction, and the thoracic spine, were obtained without intravenous contrast. COMPARISON:  Chest CTA 01/20/2017.  Bifemoral runoff CT 12/05/2018. FINDINGS: Despite efforts by the technologist and patient, mild motion artifact is present on today's exam and could not be eliminated. This reduces exam sensitivity and specificity. Patient terminated the examination prior to its completion. No axial imaging through the thoracic spine was obtained. No post-contrast imaging or lumbar spine imaging was obtained. MRI CERVICAL SPINE FINDINGS Alignment: Normal. Vertebrae: No acute or suspicious osseous findings. Cord: Normal in signal and caliber. Posterior Fossa, vertebral arteries, paraspinal tissues: Visualized portions of the posterior fossa and paraspinal soft tissues appear unremarkable. Bilateral vertebral artery flow voids. Disc levels: Disc space evaluation is limited by motion on the axial images. There is no evidence of discitis or osteomyelitis. The disc heights are well-maintained. No disc herniation, spinal stenosis or nerve root encroachment. MRI THORACIC SPINE FINDINGS Alignment:  Normal. Vertebrae: No evidence of acute fracture or focal marrow lesion. There is no evidence of discitis or osteomyelitis. Cord: The thoracic cord is normal in signal and caliber. Conus medullaris extends to the upper L1 level. Paraspinal and other soft tissues: No significant paraspinal findings on sagittal imaging. Disc levels: Thoracic disc valuation limited by the lack of axial images. The sagittal images demonstrate a small central disc protrusion at T2-3 without cord deformity or foraminal compromise. At T8-9, there is a right paracentral disc extrusion with cephalad extension of disc material behind the T8  vertebral body. This could be symptomatic, although does not deform the cord or significantly narrow the right foramen. There is a small central disc protrusion at T11 and a shallow right paracentral disc protrusion at T12-L1. No significant foraminal narrowing or nerve root encroachment identified. IMPRESSION: 1. Examination is limited. There is motion on the axial images through the cervical spine. Patient was unable to complete the axial imaging through the thoracic spine, in the lumbar spine was not imaged. 2. No significant findings in the cervical spine. 3. Several thoracic disc herniations are present, largest at T8-9 where there is right paracentral disc extrusion with cephalad extension of disc material. No cord deformity, foraminal compromise or nerve root encroachment identified. 4. No evidence of discitis or osteomyelitis. Electronically Signed   By: Carey Bullocks M.D.   On: 12/05/2018 15:09   Mr Thoracic Spine  Wo Contrast  Result Date: 12/05/2018 CLINICAL DATA:  Bilateral lower extremity weakness and numbness for 2 days. EXAM: MRI CERVICAL AND THORACIC SPINE WITHOUT CONTRAST TECHNIQUE: Multiplanar and multiecho pulse sequences of the cervical spine, to include the craniocervical junction and cervicothoracic junction, and the thoracic spine, were obtained without intravenous contrast. COMPARISON:  Chest CTA 01/20/2017.  Bifemoral runoff CT 12/05/2018. FINDINGS: Despite efforts by the technologist and patient, mild motion artifact is present on today's exam and could not be eliminated. This reduces exam sensitivity and specificity. Patient terminated the examination prior to its completion. No axial imaging through the thoracic spine was obtained. No post-contrast imaging or lumbar spine imaging was obtained. MRI CERVICAL SPINE FINDINGS Alignment: Normal. Vertebrae: No acute or suspicious osseous findings. Cord: Normal in signal and caliber. Posterior Fossa, vertebral arteries, paraspinal tissues:  Visualized portions of the posterior fossa and paraspinal soft tissues appear unremarkable. Bilateral vertebral artery flow voids. Disc levels: Disc space evaluation is limited by motion on the axial images. There is no evidence of discitis or osteomyelitis. The disc heights are well-maintained. No disc herniation, spinal stenosis or nerve root encroachment. MRI THORACIC SPINE FINDINGS Alignment:  Normal. Vertebrae: No evidence of acute fracture or focal marrow lesion. There is no evidence of discitis or osteomyelitis. Cord: The thoracic cord is normal in signal and caliber. Conus medullaris extends to the upper L1 level. Paraspinal and other soft tissues: No significant paraspinal findings on sagittal imaging. Disc levels: Thoracic disc valuation limited by the lack of axial images. The sagittal images demonstrate a small central disc protrusion at T2-3 without cord deformity or foraminal compromise. At T8-9, there is a right paracentral disc extrusion with cephalad extension of disc material behind the T8 vertebral body. This could be symptomatic, although does not deform the cord or significantly narrow the right foramen. There is a small central disc protrusion at T11 and a shallow right paracentral disc protrusion at T12-L1. No significant foraminal narrowing or nerve root encroachment identified. IMPRESSION: 1. Examination is limited. There is motion on the axial images through the cervical spine. Patient was unable to complete the axial imaging through the thoracic spine, in the lumbar spine was not imaged. 2. No significant findings in the cervical spine. 3. Several thoracic disc herniations are present, largest at T8-9 where there is right paracentral disc extrusion with cephalad extension of disc material. No cord deformity, foraminal compromise or nerve root encroachment identified. 4. No evidence of discitis or osteomyelitis. Electronically Signed   By: Richardean Sale M.D.   On: 12/05/2018 15:09   Ct  Angio Aortobifemoral W And/or Wo Contrast  Result Date: 12/05/2018 CLINICAL DATA:  38 year old male with bilateral lower extremity weakness and decreased pulses worse on the right than the left EXAM: CT ANGIOGRAPHY OF ABDOMINAL AORTA WITH ILIOFEMORAL RUNOFF TECHNIQUE: Multidetector CT imaging of the abdomen, pelvis and lower extremities was performed using the standard protocol during bolus administration of intravenous contrast. Multiplanar CT image reconstructions and MIPs were obtained to evaluate the vascular anatomy. CONTRAST:  124mL OMNIPAQUE IOHEXOL 350 MG/ML SOLN COMPARISON:  Prior CT abdomen and pelvis 05/19/2005 FINDINGS: VASCULAR Aorta: Normal caliber aorta without aneurysm, dissection, vasculitis or significant stenosis. Celiac: Patent without evidence of aneurysm, dissection, vasculitis or significant stenosis. SMA: Patent without evidence of aneurysm, dissection, vasculitis or significant stenosis. Renals: Both renal arteries are patent without evidence of aneurysm, dissection, vasculitis, fibromuscular dysplasia or significant stenosis. IMA: Patent without evidence of aneurysm, dissection, vasculitis or significant stenosis. RIGHT Lower Extremity Inflow:  Common, internal and external iliac arteries are patent without evidence of aneurysm, dissection, vasculitis or significant stenosis. Outflow: Common, superficial and profunda femoral arteries and the popliteal artery are patent without evidence of aneurysm, dissection, vasculitis or significant stenosis. Runoff: Patent three vessel runoff to the ankle. LEFT Lower Extremity Inflow: Common, internal and external iliac arteries are patent without evidence of aneurysm, dissection, vasculitis or significant stenosis. Outflow: Common, superficial and profunda femoral arteries and the popliteal artery are patent without evidence of aneurysm, dissection, vasculitis or significant stenosis. Runoff: Patent three vessel runoff to the ankle. Veins: No obvious  venous abnormality within the limitations of this arterial phase study. Review of the MIP images confirms the above findings. NON-VASCULAR Lower chest: The lung bases are clear. Visualized cardiac structures are within normal limits for size. No pericardial effusion. Unremarkable visualized distal thoracic esophagus. Hepatobiliary: No focal liver abnormality is seen. No gallstones, gallbladder wall thickening, or biliary dilatation. Pancreas: Unremarkable. No pancreatic ductal dilatation or surrounding inflammatory changes. Spleen: Normal in size without focal abnormality. Adrenals/Urinary Tract: Adrenal glands are unremarkable. Kidneys are normal, without renal calculi, focal lesion, or hydronephrosis. Bladder is unremarkable. Stomach/Bowel: Stomach is within normal limits. Appendix appears normal. No evidence of bowel wall thickening, distention, or inflammatory changes. Lymphatic: No suspicious lymphadenopathy. Reproductive: Prostate is unremarkable. Other: No evidence of abdominal wall hernia or ascites. The bladder is full. Musculoskeletal: Lower lumbar degenerative disc disease with broad-based disc bulges at L4-L5 and L5-S1. IMPRESSION: VASCULAR 1. Normal CTA abdomen and pelvis with bilateral lower extremity runoff. NON-VASCULAR 1. Lower lumbar degenerative disc disease at L4-L5 and L5-S1. Electronically Signed   By: Malachy Moan M.D.   On: 12/05/2018 11:00   Assessment:  38 year old male with history of heroin abuse (snorts) presents with sudden onset back pain and bilateral foot drop, lower extremity weakness with bilateral lower extremity numbness tingling.  Differentials include conus medullaris, however he denies significant  bladder incontinence but does complain of pain with brisk patellar reflexes, would typically not see absent ankle reflexes though.  Cauda equina also possibility but patient does not have as much weakness as you would expect and typically we do not see brisk patellar  reflexes.  Other possibility includes AIDP which would be unusual given sudden onset of presentation. MRI L-spine will be crucial in diagnosis.  Also would appreciate assistance of neurosurgery to evaluate this patient with sudden bilateral lower extremity weakness.    Impression Bilateral foot droop with hip flexion weakness (unclear if it is due to limitation from pain) D/D Consider Conus medullaris vs cauda equina syndrome/Spinal Epidural Abscess (given h/ of heroin abuse) vs spinal stenosis with L5- S1 radiculopathy vs bilateral peroneal neuropathy.    Recommendations:  Neurosurgery consult  Pain meds before repeat MRI exam, also consider Ativan if needed Blood cultures Consider empiric steroids, obtain blood cultures If MRI negative, will need LP, low suspicion for AIDP however  Valentina Lucks, MSN, NP-C [facility] [phone]  Attending neurologist's note to follow    12/06/2018, 9:27 AM

## 2018-12-06 NOTE — ED Notes (Signed)
No addl blood draw,  Pt enroute to floor. 

## 2018-12-07 ENCOUNTER — Encounter (HOSPITAL_COMMUNITY): Payer: Self-pay

## 2018-12-07 ENCOUNTER — Other Ambulatory Visit: Payer: Self-pay

## 2018-12-07 LAB — HIV ANTIBODY (ROUTINE TESTING W REFLEX): HIV Screen 4th Generation wRfx: NONREACTIVE

## 2018-12-07 MED ORDER — METHADONE HCL 10 MG PO TABS
10.0000 mg | ORAL_TABLET | Freq: Every day | ORAL | Status: DC
  Administered 2018-12-07: 10 mg via ORAL
  Filled 2018-12-07: qty 1

## 2018-12-07 MED ORDER — SODIUM PHOSPHATES 45 MMOLE/15ML IV SOLN
30.0000 mmol | Freq: Once | INTRAVENOUS | Status: AC
  Administered 2018-12-07: 30 mmol via INTRAVENOUS
  Filled 2018-12-07: qty 10

## 2018-12-07 NOTE — Progress Notes (Signed)
PROGRESS NOTE  Craig Woodward HDQ:[id number] DOB: 12-Jun-1980 DOA: 12/04/2018 PCP: Patient, No Pcp Per  HPI/Recap of past 24 hours:  Craig Woodward is a 38 y.o. male with medical history significant of heroin abuse, ADHD, allergy, asthma, back pain presenting to the hospital for evaluation of bilateral leg weakness.  Patient states his legs are weak for the past 2 days from the ankle down and he has not been able to feel his feet.  He has not been able to walk.  Denies saddle anesthesia or bladder/fecal incontinence.  Denies any fevers or chills.  States he snorts heroin, last used 3 days ago.  Patient initially denied back pain but then later stated he had some lower back pain while in the MRI machine.  Denies injury to his back or legs.  No additional history could be obtained from him.  ED Course: Hemodynamically stable.  Patient has been in the ED since 12/04/2018 p.m.  CBC done yesterday showing no leukocytosis.  CMP showing elevated transaminases (AST 99, ALT 53).  Alk phos and T bili normal.  SARS-CoV-2 test negative.  ESR pending.  Blood culture x2 pending.  Noted to have cool feet with absent pedal pulses on the right, diminished on the left.  CTA aortobifemoral negative for occlusion.  Given history of drug abuse, MRI of cervical, thoracic, and lumbar spine was ordered.  2 attempts were made to get MRI done but patient was not able to tolerate it.  The examination was limited and motion degraded.  Patient was unable to complete the axial imaging throughout the thoracic spine and the lumbar spine was not imaged. No significant findings seen in the cervical spine.  Several thoracic disc herniations, largest at T8-9 where there is right paracentral disc extrusion with cephalad extension of disc material.  No cord deformity, foraminal compromise or nerve root encroachment identified.  No evidence of discitis or osteomyelitis. Per ED provider, patient is able to perform straight leg raise but states  he has no strength when he stands up. Case was discussed with Dr. Lorraine Lax from neurology who will consult. Patient received a 500 cc fluid bolus. Other imaging done in the ED: Chest x-ray showing findings consistent with COPD and no active cardiopulmonary disease.  12/07/18: Patient seen and examined at bedside.  He is somnolent but easily arousable to voices.  Started on methadone to avoid opiate withdrawal in the setting of heroin IV drug abuse.  Declined MRI last night.   Assessment/Plan: Principal Problem:   Foot drop, bilateral Active Problems:   Anxiety   Abnormal transaminases   Heroin abuse (HCC)   Asthma   Lower extremity weakness  Bilateral foot drop, unclear etiology Neurology has been consulted and following MRI ordered and pending If MRI is negative, will need LP per neurology  Concern for IV drug use Blood cultures x2 in process Currently afebrile with no leukocytosis Monitor fever curve and WBC  Polysubstance abuse including cocaine, THC, IV drug use with heroin Polysubstance abuse cessation counseling when more alert Social worker consulted to assist with resources for polysubstance abuse cessation UDS done on 12/06/2018 showed amphetamine, opiates and benzodiazepines QTC obtained no QTC prolongation Methadone started 10 mg daily x3 days. Closely monitor for any signs of withdrawal.  Hypophosphatemia Phosphorus 1.9 Repleted with sodium phosphate Encourage increase oral intake  Elevated LFTs Abdominal ultrasound unrevealing, no gallstones or wall thickening 10 to monitor CRP and sed rate negative  History of asthma Stable Continue albuterol as needed  Anxiety Continue Lexapro   DVT prophylaxis: Subcutaneous heparin Code Status: Full code Family Communication: No family available. Disposition Plan: Anticipate discharge when neurosurgery and neurology sign off. Consults called: Neurosurgery, neurology    Objective: Vitals:   12/06/18 2151  12/07/18 0500 12/07/18 0545 12/07/18 1249  BP: 107/63  118/79 112/71  Pulse: 72  68 71  Resp: '19  20 14  '$ Temp: 98 F (36.7 C)  98.4 F (36.9 C) 97.9 F (36.6 C)  TempSrc:    Oral  SpO2: 98%  98% 97%  Weight:  66.7 kg    Height:        Intake/Output Summary (Last 24 hours) at 12/07/2018 1413 Last data filed at 12/07/2018 1000 Gross per 24 hour  Intake 99.23 ml  Output 1 ml  Net 98.23 ml   Filed Weights   12/06/18 0200 12/07/18 0500  Weight: 70 kg 66.7 kg    Exam:  . General: 38 y.o. year-old male developed well-nourished.  No acute distress.  Somnolent but easily arousable to voices. . Cardiovascular: Regular rate and rhythm no rubs or gallops no JVD or thyromegaly.   Marland Kitchen Respiratory: Clear to auscultation no wheeze or rales.  Poor inspiratory effort. . Abdomen: Soft nontender nondistended normal bowel sounds present.  . Musculoskeletal: Lower extremity edema.  2/4 pulses in all 4 extremities.. . Psychiatry: Unable to assess mood due to somnolence.   Data Reviewed: CBC: Recent Labs  Lab 12/04/18 2241 12/04/18 2305 12/05/18 0940  WBC 8.8  --   --   HGB 15.2 16.0 13.6  HCT 46.5 47.0 40.0  MCV 91.4  --   --   PLT 303  --   --    Basic Metabolic Panel: Recent Labs  Lab 12/04/18 2305 12/05/18 0841 12/05/18 0940 12/06/18 0216  NA 137 136 139  --   K 4.4 3.9 4.8  --   CL 96* 98  --   --   CO2  --  27  --   --   GLUCOSE 150* 132*  --   --   BUN 20 14  --   --   CREATININE 0.90 0.74  --   --   CALCIUM  --  8.9  --   --   MG  --   --   --  2.2  PHOS  --   --   --  1.9*   GFR: Estimated Creatinine Clearance: 120.4 mL/min (by C-G formula based on SCr of 0.74 mg/dL). Liver Function Tests: Recent Labs  Lab 12/05/18 0841  AST 99*  ALT 53*  ALKPHOS 71  BILITOT 0.6  PROT 6.7  ALBUMIN 3.8   No results for input(s): LIPASE, AMYLASE in the last 168 hours. No results for input(s): AMMONIA in the last 168 hours. Coagulation Profile: No results for input(s): INR,  PROTIME in the last 168 hours. Cardiac Enzymes: No results for input(s): CKTOTAL, CKMB, CKMBINDEX, TROPONINI in the last 168 hours. BNP (last 3 results) No results for input(s): PROBNP in the last 8760 hours. HbA1C: No results for input(s): HGBA1C in the last 72 hours. CBG: No results for input(s): GLUCAP in the last 168 hours. Lipid Profile: No results for input(s): CHOL, HDL, LDLCALC, TRIG, CHOLHDL, LDLDIRECT in the last 72 hours. Thyroid Function Tests: No results for input(s): TSH, T4TOTAL, FREET4, T3FREE, THYROIDAB in the last 72 hours. Anemia Panel: No results for input(s): VITAMINB12, FOLATE, FERRITIN, TIBC, IRON, RETICCTPCT in the last 72 hours. Urine analysis:  Component Value Date/Time   COLORURINE YELLOW 12/05/2018 1245   APPEARANCEUR CLEAR 12/05/2018 1245   LABSPEC 1.030 12/05/2018 1245   PHURINE 5.0 12/05/2018 1245   GLUCOSEU 50 (A) 12/05/2018 1245   HGBUR NEGATIVE 12/05/2018 1245   BILIRUBINUR NEGATIVE 12/05/2018 1245   KETONESUR 5 (A) 12/05/2018 1245   PROTEINUR NEGATIVE 12/05/2018 1245   NITRITE NEGATIVE 12/05/2018 1245   LEUKOCYTESUR NEGATIVE 12/05/2018 1245   Sepsis Labs: '@LABRCNTIP'$ (procalcitonin:4,lacticidven:4)  ) Recent Results (from the past 240 hour(s))  SARS Coronavirus 2 [facility] order, Performed in [facility] hospital lab) Nasopharyngeal Nasopharyngeal Swab     Status: None   Collection Time: 12/05/18  8:43 AM   Specimen: Nasopharyngeal Swab  Result Value Ref Range Status   SARS Coronavirus 2 NEGATIVE NEGATIVE Final    Comment: (NOTE) If result is NEGATIVE SARS-CoV-2 target nucleic acids are NOT DETECTED. The SARS-CoV-2 RNA is generally detectable in upper and lower  respiratory specimens during the acute phase of infection. The lowest  concentration of SARS-CoV-2 viral copies this assay can detect is 250  copies / mL. A negative result does not preclude SARS-CoV-2 infection  and should not be used as the sole basis for treatment or other   patient management decisions.  A negative result may occur with  improper specimen collection / handling, submission of specimen other  than nasopharyngeal swab, presence of viral mutation(s) within the  areas targeted by this assay, and inadequate number of viral copies  (<250 copies / mL). A negative result must be combined with clinical  observations, patient history, and epidemiological information. If result is POSITIVE SARS-CoV-2 target nucleic acids are DETECTED. The SARS-CoV-2 RNA is generally detectable in upper and lower  respiratory specimens dur ing the acute phase of infection.  Positive  results are indicative of active infection with SARS-CoV-2.  Clinical  correlation with patient history and other diagnostic information is  necessary to determine patient infection status.  Positive results do  not rule out bacterial infection or co-infection with other viruses. If result is PRESUMPTIVE POSTIVE SARS-CoV-2 nucleic acids MAY BE PRESENT.   A presumptive positive result was obtained on the submitted specimen  and confirmed on repeat testing.  While 2019 novel coronavirus  (SARS-CoV-2) nucleic acids may be present in the submitted sample  additional confirmatory testing may be necessary for epidemiological  and / or clinical management purposes  to differentiate between  SARS-CoV-2 and other Sarbecovirus currently known to infect humans.  If clinically indicated additional testing with an alternate test  methodology [phone]) is advised. The SARS-CoV-2 RNA is generally  detectable in upper and lower respiratory sp ecimens during the acute  phase of infection. The expected result is Negative. Fact Sheet for Patients:  StrictlyIdeas.no Fact Sheet for Healthcare Providers: BankingDealers.co.za This test is not yet approved or cleared by the [place] FDA and has been authorized for detection and/or diagnosis of SARS-CoV-2 by  FDA under an Emergency Use Authorization (EUA).  This EUA will remain in effect (meaning this test can be used) for the duration of the COVID-19 declaration under Section 564(b)(1) of the Act, 21 U.S.C. section 360bbb-3(b)(1), unless the authorization is terminated or revoked sooner. Performed at [facility], [place] [address] [place], [place] [address]   Culture, blood (Routine X 2) w Reflex to ID Panel     Status: None (Preliminary result)   Collection Time: 12/06/18  2:26 AM   Specimen: BLOOD  Result Value Ref Range Status   Specimen Description  BLOOD LEFT ANTECUBITAL  Final   Special Requests   Final    BOTTLES DRAWN AEROBIC AND ANAEROBIC Blood Culture adequate volume   Culture   Final    NO GROWTH 1 DAY Performed at [facility], [address], [place], [place] [address]    Report Status PENDING  Incomplete  Culture, blood (Routine X 2) w Reflex to ID Panel     Status: None (Preliminary result)   Collection Time: 12/06/18  2:28 AM   Specimen: BLOOD  Result Value Ref Range Status   Specimen Description BLOOD LEFT ANTECUBITAL  Final   Special Requests   Final    BOTTLES DRAWN AEROBIC AND ANAEROBIC Blood Culture adequate volume   Culture   Final    NO GROWTH 1 DAY Performed at [facility], [place] [address], [place], [place] [address]    Report Status PENDING  Incomplete      Studies: No results found.  Scheduled Meds: . escitalopram  20 mg Oral Daily  . heparin  5,000 Units Subcutaneous Q8H  . methadone  10 mg Oral Daily    Continuous Infusions:   LOS: 1 day     Kayleen Memos, MD [facility] Pager [phone]  If 7PM-7AM, please contact night-coverage [email or web address] Password [email or web address] 12/07/2018, 2:13 PM

## 2018-12-07 NOTE — Progress Notes (Signed)
Subjective: The patient is alert and pleasant.  He is in no apparent distress.  He says he cannot go through with his lumbar MRI because he is "withdrawing".  Objective: Vital signs in last 24 hours: Temp:  [36.7 C-36.9 C] 36.9 C (09/06 0545) Pulse Rate:  [59-72] 68 (09/06 0545) Resp:  [16-20] 20 (09/06 0545) BP: (107-124)/(63-80) 118/79 (09/06 0545) SpO2:  [98 %-100 %] 98 % (09/06 0545) Weight:  [66.7 kg] 66.7 kg (09/06 0500) Estimated body mass index is 19.94 kg/m as calculated from the following:   Height as of this encounter: 6' (1.829 m).   Weight as of this encounter: 66.7 kg.   Intake/Output from previous day: 09/05 0701 - 09/06 0700 In: -  Out: 2 [Stool:2] Intake/Output this shift: No intake/output data recorded.  Physical exam the patient is alert and oriented.  He is moving all 4 extremities well.  He has bilateral foot drops.  He also has high arched feet.  Lab Results: Recent Labs    12/04/18 2241 12/04/18 2305 12/05/18 0940  WBC 8.8  --   --   HGB 15.2 16.0 13.6  HCT 46.5 47.0 40.0  PLT 303  --   --    BMET Recent Labs    12/04/18 2305 12/05/18 0841 12/05/18 0940  NA 137 136 139  K 4.4 3.9 4.8  CL 96* 98  --   CO2  --  27  --   GLUCOSE 150* 132*  --   BUN 20 14  --   CREATININE 0.90 0.74  --   CALCIUM  --  8.9  --     Studies/Results: Dg Chest 2 View  Result Date: 12/05/2018 CLINICAL DATA:  Shortness of breath and wheezing. EXAM: CHEST - 2 VIEW COMPARISON:  Chest x-ray dated May 30, 2017. FINDINGS: The heart size and mediastinal contours are within normal limits. Normal pulmonary vascularity. The lungs are hyperinflated. No focal consolidation, pleural effusion, or pneumothorax. No acute osseous abnormality. IMPRESSION: 1.  No active cardiopulmonary disease. 2. COPD. Electronically Signed   By: Obie DredgeWilliam T Derry M.D.   On: 12/05/2018 11:32   Mr Cervical Spine Wo Contrast  Result Date: 12/05/2018 CLINICAL DATA:  Bilateral lower extremity  weakness and numbness for 2 days. EXAM: MRI CERVICAL AND THORACIC SPINE WITHOUT CONTRAST TECHNIQUE: Multiplanar and multiecho pulse sequences of the cervical spine, to include the craniocervical junction and cervicothoracic junction, and the thoracic spine, were obtained without intravenous contrast. COMPARISON:  Chest CTA 01/20/2017.  Bifemoral runoff CT 12/05/2018. FINDINGS: Despite efforts by the technologist and patient, mild motion artifact is present on today's exam and could not be eliminated. This reduces exam sensitivity and specificity. Patient terminated the examination prior to its completion. No axial imaging through the thoracic spine was obtained. No post-contrast imaging or lumbar spine imaging was obtained. MRI CERVICAL SPINE FINDINGS Alignment: Normal. Vertebrae: No acute or suspicious osseous findings. Cord: Normal in signal and caliber. Posterior Fossa, vertebral arteries, paraspinal tissues: Visualized portions of the posterior fossa and paraspinal soft tissues appear unremarkable. Bilateral vertebral artery flow voids. Disc levels: Disc space evaluation is limited by motion on the axial images. There is no evidence of discitis or osteomyelitis. The disc heights are well-maintained. No disc herniation, spinal stenosis or nerve root encroachment. MRI THORACIC SPINE FINDINGS Alignment:  Normal. Vertebrae: No evidence of acute fracture or focal marrow lesion. There is no evidence of discitis or osteomyelitis. Cord: The thoracic cord is normal in signal and caliber. Conus medullaris extends  to the upper L1 level. Paraspinal and other soft tissues: No significant paraspinal findings on sagittal imaging. Disc levels: Thoracic disc valuation limited by the lack of axial images. The sagittal images demonstrate a small central disc protrusion at T2-3 without cord deformity or foraminal compromise. At T8-9, there is a right paracentral disc extrusion with cephalad extension of disc material behind the T8  vertebral body. This could be symptomatic, although does not deform the cord or significantly narrow the right foramen. There is a small central disc protrusion at T11 and a shallow right paracentral disc protrusion at T12-L1. No significant foraminal narrowing or nerve root encroachment identified. IMPRESSION: 1. Examination is limited. There is motion on the axial images through the cervical spine. Patient was unable to complete the axial imaging through the thoracic spine, in the lumbar spine was not imaged. 2. No significant findings in the cervical spine. 3. Several thoracic disc herniations are present, largest at T8-9 where there is right paracentral disc extrusion with cephalad extension of disc material. No cord deformity, foraminal compromise or nerve root encroachment identified. 4. No evidence of discitis or osteomyelitis. Electronically Signed   By: Richardean Sale M.D.   On: 12/05/2018 15:09   Mr Thoracic Spine Wo Contrast  Result Date: 12/05/2018 CLINICAL DATA:  Bilateral lower extremity weakness and numbness for 2 days. EXAM: MRI CERVICAL AND THORACIC SPINE WITHOUT CONTRAST TECHNIQUE: Multiplanar and multiecho pulse sequences of the cervical spine, to include the craniocervical junction and cervicothoracic junction, and the thoracic spine, were obtained without intravenous contrast. COMPARISON:  Chest CTA 01/20/2017.  Bifemoral runoff CT 12/05/2018. FINDINGS: Despite efforts by the technologist and patient, mild motion artifact is present on today's exam and could not be eliminated. This reduces exam sensitivity and specificity. Patient terminated the examination prior to its completion. No axial imaging through the thoracic spine was obtained. No post-contrast imaging or lumbar spine imaging was obtained. MRI CERVICAL SPINE FINDINGS Alignment: Normal. Vertebrae: No acute or suspicious osseous findings. Cord: Normal in signal and caliber. Posterior Fossa, vertebral arteries, paraspinal tissues:  Visualized portions of the posterior fossa and paraspinal soft tissues appear unremarkable. Bilateral vertebral artery flow voids. Disc levels: Disc space evaluation is limited by motion on the axial images. There is no evidence of discitis or osteomyelitis. The disc heights are well-maintained. No disc herniation, spinal stenosis or nerve root encroachment. MRI THORACIC SPINE FINDINGS Alignment:  Normal. Vertebrae: No evidence of acute fracture or focal marrow lesion. There is no evidence of discitis or osteomyelitis. Cord: The thoracic cord is normal in signal and caliber. Conus medullaris extends to the upper L1 level. Paraspinal and other soft tissues: No significant paraspinal findings on sagittal imaging. Disc levels: Thoracic disc valuation limited by the lack of axial images. The sagittal images demonstrate a small central disc protrusion at T2-3 without cord deformity or foraminal compromise. At T8-9, there is a right paracentral disc extrusion with cephalad extension of disc material behind the T8 vertebral body. This could be symptomatic, although does not deform the cord or significantly narrow the right foramen. There is a small central disc protrusion at T11 and a shallow right paracentral disc protrusion at T12-L1. No significant foraminal narrowing or nerve root encroachment identified. IMPRESSION: 1. Examination is limited. There is motion on the axial images through the cervical spine. Patient was unable to complete the axial imaging through the thoracic spine, in the lumbar spine was not imaged. 2. No significant findings in the cervical spine. 3. Several thoracic disc  herniations are present, largest at T8-9 where there is right paracentral disc extrusion with cephalad extension of disc material. No cord deformity, foraminal compromise or nerve root encroachment identified. 4. No evidence of discitis or osteomyelitis. Electronically Signed   By: Carey BullocksWilliam  Veazey M.D.   On: 12/05/2018 15:09   Ct  Angio Aortobifemoral W And/or Wo Contrast  Result Date: 12/05/2018 CLINICAL DATA:  38 year old male with bilateral lower extremity weakness and decreased pulses worse on the right than the left EXAM: CT ANGIOGRAPHY OF ABDOMINAL AORTA WITH ILIOFEMORAL RUNOFF TECHNIQUE: Multidetector CT imaging of the abdomen, pelvis and lower extremities was performed using the standard protocol during bolus administration of intravenous contrast. Multiplanar CT image reconstructions and MIPs were obtained to evaluate the vascular anatomy. CONTRAST:  100mL OMNIPAQUE IOHEXOL 350 MG/ML SOLN COMPARISON:  Prior CT abdomen and pelvis 05/19/2005 FINDINGS: VASCULAR Aorta: Normal caliber aorta without aneurysm, dissection, vasculitis or significant stenosis. Celiac: Patent without evidence of aneurysm, dissection, vasculitis or significant stenosis. SMA: Patent without evidence of aneurysm, dissection, vasculitis or significant stenosis. Renals: Both renal arteries are patent without evidence of aneurysm, dissection, vasculitis, fibromuscular dysplasia or significant stenosis. IMA: Patent without evidence of aneurysm, dissection, vasculitis or significant stenosis. RIGHT Lower Extremity Inflow: Common, internal and external iliac arteries are patent without evidence of aneurysm, dissection, vasculitis or significant stenosis. Outflow: Common, superficial and profunda femoral arteries and the popliteal artery are patent without evidence of aneurysm, dissection, vasculitis or significant stenosis. Runoff: Patent three vessel runoff to the ankle. LEFT Lower Extremity Inflow: Common, internal and external iliac arteries are patent without evidence of aneurysm, dissection, vasculitis or significant stenosis. Outflow: Common, superficial and profunda femoral arteries and the popliteal artery are patent without evidence of aneurysm, dissection, vasculitis or significant stenosis. Runoff: Patent three vessel runoff to the ankle. Veins: No obvious  venous abnormality within the limitations of this arterial phase study. Review of the MIP images confirms the above findings. NON-VASCULAR Lower chest: The lung bases are clear. Visualized cardiac structures are within normal limits for size. No pericardial effusion. Unremarkable visualized distal thoracic esophagus. Hepatobiliary: No focal liver abnormality is seen. No gallstones, gallbladder wall thickening, or biliary dilatation. Pancreas: Unremarkable. No pancreatic ductal dilatation or surrounding inflammatory changes. Spleen: Normal in size without focal abnormality. Adrenals/Urinary Tract: Adrenal glands are unremarkable. Kidneys are normal, without renal calculi, focal lesion, or hydronephrosis. Bladder is unremarkable. Stomach/Bowel: Stomach is within normal limits. Appendix appears normal. No evidence of bowel wall thickening, distention, or inflammatory changes. Lymphatic: No suspicious lymphadenopathy. Reproductive: Prostate is unremarkable. Other: No evidence of abdominal wall hernia or ascites. The bladder is full. Musculoskeletal: Lower lumbar degenerative disc disease with broad-based disc bulges at L4-L5 and L5-S1. IMPRESSION: VASCULAR 1. Normal CTA abdomen and pelvis with bilateral lower extremity runoff. NON-VASCULAR 1. Lower lumbar degenerative disc disease at L4-L5 and L5-S1. Electronically Signed   By: Malachy MoanHeath  McCullough M.D.   On: 12/05/2018 11:00   [place] Abdomen Limited Ruq  Result Date: 12/06/2018 CLINICAL DATA:  Elevated liver function tests. EXAM: ULTRASOUND ABDOMEN LIMITED RIGHT UPPER QUADRANT COMPARISON:  Abdominal ultrasound 05/08/2007. CT angiogram abdomen and pelvis 12/05/2018. FINDINGS: Gallbladder: No gallstones or wall thickening visualized. No sonographic Murphy sign noted by sonographer. Common bile duct: Diameter: 0.3 cm Liver: No focal lesion identified. Within normal limits in parenchymal echogenicity. Portal vein is patent on color Doppler imaging with normal direction of  blood flow towards the liver. Other: None. IMPRESSION: Negative exam. Electronically Signed   By: Drusilla Kannerhomas  Dalessio M.D.  On: 12/06/2018 09:40    Assessment/Plan: Bilateral foot drops: We are awaiting a lumbar MRI to see if there is any significant neural compression to explain his bilateral foot weakness.  LOS: 1 day     Cristi Loron 12/07/2018, 10:35 AM

## 2018-12-07 NOTE — Progress Notes (Addendum)
NEURO HOSPITALIST PROGRESS NOTE   Subjective: Patient sleeping, NAD. Still no L-spine MRI.  Exam: Vitals:   12/06/18 2151 12/07/18 0545  BP: 107/63 118/79  Pulse: 72 68  Resp: 19 20  Temp: 98 F (36.7 C) 98.4 F (36.9 C)  SpO2: 98% 98%    Physical Exam   HEENT-  Normocephalic, no lesions, without obvious abnormality.  Normal external eye and conjunctiva.   Cardiovascular- S, pulses palpable throughout   Lungs no excessive working breathing.  Saturations within normal limits on  RA Abdomen- All 4 quadrants palpated and nontender Extremities- Warm, dry and intact Musculoskeletal-no joint tenderness, deformity or swelling    Neuro:  Mental Status: Alert, oriented, thought content appropriate.  Speech fluent without evidence of aphasia.  Able to follow  commands without difficulty. Cranial Nerves: II:  Visual fields grossly normal,  III,IV, VI: ptosis not present, extra-ocular motions intact bilaterally pupils equal, round, reactive to light and accommodation V,VII: smile symmetric, facial light touch sensation normal bilaterally VIII: hearing normal bilaterally IX,X: uvula rises symmetrically XI: bilateral shoulder shrug XII: midline tongue extension Motor: Right :  Upper extremity                                              Left:     Upper extremity 5/5 deltoid                                                                   5/5 deltoid 5/5 tricep                                                                     5/5 tricep 5/5 biceps                                                                    5/5 biceps  5/5wrist flexion                                                            5/5 wrist flexion 5/5 wrist extension  5/5 wrist extension 5/5 hand grip                                                               5/5 hand grip      Lower extremity                                                           Lower extremity 4+/5 hip flexion                                                            5/5 hip flexion 5/5 hip extension                                                         5/5 hip extension 5/5 hip adductors                                                        5/5 hip adductors 5/5 knee flexion                                                            5/5 knee flexion 5/5 knee extension                                                       5/5 knee extension 0/5 plantar flexion                                                       0/5 plantar flexion 1/5 plantar extension                                                  1/5 plantar extension Tone and bulk:normal tone throughout; no atrophy noted Sensory:  light touch intact throughout, bilaterally  Patient states that bilaterally he feels  a burning  sensation in his feet when plantars are  tested Deep Tendon Reflexes: 2+ and symmetric biceps, patella and brachioradialis 0  Bilateral achilles Plantars: Right: downgoing   Left: downgoing Cerebellar: Normal FNF,  Gait: deferred    Medications:  Scheduled: . escitalopram  20 mg Oral Daily  . heparin  5,000 Units Subcutaneous Q8H  . methadone  10 mg Oral Daily   Continuous: . sodium phosphate  Dextrose 5% IVPB 30 mmol (12/07/18 1324)   MWN:[id number] **OR** acetaminophen, albuterol, traMADol  Pertinent Labs/Diagnostics: Blood cultures: no growth at 1 day. UDS 9/5: + opiates, benzo's, amphetamines  Mr Cervical Spine Wo Contrast  Result Date: 12/05/2018 CLINICAL DATA:  Bilateral lower extremity weakness and numbness for 2 days. EXAM: MRI CERVICAL AND THORACIC SPINE WITHOUT CONTRAST TECHNIQUE: Multiplanar and multiecho pulse sequences of the cervical spine, to include the craniocervical junction and cervicothoracic junction, and the thoracic spine, were obtained without intravenous contrast. COMPARISON:  Chest CTA 01/20/2017.  Bifemoral runoff CT 12/05/2018.  FINDINGS: Despite efforts by the technologist and patient, mild motion artifact is present on today's exam and could not be eliminated. This reduces exam sensitivity and specificity. Patient terminated the examination prior to its completion. No axial imaging through the thoracic spine was obtained. No post-contrast imaging or lumbar spine imaging was obtained. MRI CERVICAL SPINE FINDINGS Alignment: Normal. Vertebrae: No acute or suspicious osseous findings. Cord: Normal in signal and caliber. Posterior Fossa, vertebral arteries, paraspinal tissues: Visualized portions of the posterior fossa and paraspinal soft tissues appear unremarkable. Bilateral vertebral artery flow voids. Disc levels: Disc space evaluation is limited by motion on the axial images. There is no evidence of discitis or osteomyelitis. The disc heights are well-maintained. No disc herniation, spinal stenosis or nerve root encroachment. MRI THORACIC SPINE FINDINGS Alignment:  Normal. Vertebrae: No evidence of acute fracture or focal marrow lesion. There is no evidence of discitis or osteomyelitis. Cord: The thoracic cord is normal in signal and caliber. Conus medullaris extends to the upper L1 level. Paraspinal and other soft tissues: No significant paraspinal findings on sagittal imaging. Disc levels: Thoracic disc valuation limited by the lack of axial images. The sagittal images demonstrate a small central disc protrusion at T2-3 without cord deformity or foraminal compromise. At T8-9, there is a right paracentral disc extrusion with cephalad extension of disc material behind the T8 vertebral body. This could be symptomatic, although does not deform the cord or significantly narrow the right foramen. There is a small central disc protrusion at T11 and a shallow right paracentral disc protrusion at T12-L1. No significant foraminal narrowing or nerve root encroachment identified. IMPRESSION: 1. Examination is limited. There is motion on the axial  images through the cervical spine. Patient was unable to complete the axial imaging through the thoracic spine, in the lumbar spine was not imaged. 2. No significant findings in the cervical spine. 3. Several thoracic disc herniations are present, largest at T8-9 where there is right paracentral disc extrusion with cephalad extension of disc material. No cord deformity, foraminal compromise or nerve root encroachment identified. 4. No evidence of discitis or osteomyelitis. Electronically Signed   By: Carey Bullocks M.D.   On: 12/05/2018 15:09   Mr Thoracic Spine Wo Contrast  Result Date: 12/05/2018 CLINICAL DATA:  Bilateral lower extremity weakness and numbness for 2 days. EXAM: MRI CERVICAL AND THORACIC SPINE WITHOUT CONTRAST TECHNIQUE: Multiplanar and multiecho pulse sequences of the cervical spine, to include the craniocervical junction and cervicothoracic junction, and the thoracic spine, were obtained without intravenous contrast. COMPARISON:  Chest CTA 01/20/2017.  Bifemoral runoff CT 12/05/2018. FINDINGS: Despite efforts by the technologist and patient, mild motion artifact is present on today's exam and could not be eliminated. This reduces exam sensitivity and specificity. Patient terminated the examination prior to its completion. No axial imaging through the thoracic spine was obtained. No post-contrast imaging or lumbar spine imaging was obtained. MRI CERVICAL SPINE FINDINGS Alignment: Normal. Vertebrae: No acute or suspicious osseous findings. Cord: Normal in signal and caliber. Posterior Fossa, vertebral arteries, paraspinal tissues: Visualized portions of the posterior fossa and paraspinal soft tissues appear unremarkable. Bilateral vertebral artery flow voids. Disc levels: Disc space evaluation is limited by motion on the axial images. There is no evidence of discitis or osteomyelitis. The disc heights are well-maintained. No disc herniation, spinal stenosis or nerve root encroachment. MRI  THORACIC SPINE FINDINGS Alignment:  Normal. Vertebrae: No evidence of acute fracture or focal marrow lesion. There is no evidence of discitis or osteomyelitis. Cord: The thoracic cord is normal in signal and caliber. Conus medullaris extends to the upper L1 level. Paraspinal and other soft tissues: No significant paraspinal findings on sagittal imaging. Disc levels: Thoracic disc valuation limited by the lack of axial images. The sagittal images demonstrate a small central disc protrusion at T2-3 without cord deformity or foraminal compromise. At T8-9, there is a right paracentral disc extrusion with cephalad extension of disc material behind the T8 vertebral body. This could be symptomatic, although does not deform the cord or significantly narrow the right foramen. There is a small central disc protrusion at T11 and a shallow right paracentral disc protrusion at T12-L1. No significant foraminal narrowing or nerve root encroachment identified. IMPRESSION: 1. Examination is limited. There is motion on the axial images through the cervical spine. Patient was unable to complete the axial imaging through the thoracic spine, in the lumbar spine was not imaged. 2. No significant findings in the cervical spine. 3. Several thoracic disc herniations are present, largest at T8-9 where there is right paracentral disc extrusion with cephalad extension of disc material. No cord deformity, foraminal compromise or nerve root encroachment identified. 4. No evidence of discitis or osteomyelitis. Electronically Signed   By: Carey BullocksWilliam  Veazey M.D.   On: 12/05/2018 15:09   [place] Abdomen Limited Ruq  Result Date: 12/06/2018 CLINICAL DATA:  Elevated liver function tests. EXAM: ULTRASOUND ABDOMEN LIMITED RIGHT UPPER QUADRANT COMPARISON:  Abdominal ultrasound 05/08/2007. CT angiogram abdomen and pelvis 12/05/2018. FINDINGS: Gallbladder: No gallstones or wall thickening visualized. No sonographic Murphy sign noted by sonographer. Common bile  duct: Diameter: 0.3 cm Liver: No focal lesion identified. Within normal limits in parenchymal echogenicity. Portal vein is patent on color Doppler imaging with normal direction of blood flow towards the liver. Other: None. IMPRESSION: Negative exam. Electronically Signed   By: Drusilla Kannerhomas  Dalessio M.D.   On: 12/06/2018 09:40   Assessment:  38 year old male with history of heroin abuse (snorts) presents with sudden onset back pain and bilateral foot drop, lower extremity weakness with bilateral lower extremity numbness tingling.  Differentials include conus medullaris, however he denies significant  bladder incontinence but does complain of pain with brisk patellar reflexes, would typically not see absent ankle reflexes though.  Cauda equina also possibility but patient does not have as much weakness as you would expect and typically we do not see brisk patellar reflexes.  Other possibility includes AIDP which would be unusual given sudden onset of presentation. MRI L-spine will be crucial in diagnosis.  Also would appreciate assistance of neurosurgery to evaluate this  patient with sudden bilateral lower extremity weakness.    Impression Bilateral foot droop with hip flexion weakness (unclear if it is due to limitation from pain) D/D Consider Conus medullaris vs cauda equina syndrome/Spinal Epidural Abscess (given h/ of heroin abuse) vs spinal stenosis with L5- S1 radiculopathy vs bilateral peroneal neuropathy.    Recommendations:  Pain meds before repeat MRI exam, also consider Ativan if needed Consider empiric steroids,  If MRI negative, will need LP, low suspicion for AIDP however  Valentina Lucks, MSN, NP-C [facility] [phone]  Attending neurologist's note to follow  12/07/2018, 11:20 AM  I have seen the patient and reviewed the above note.  He was using and the timeframe where this started is markedly blurry in the patient's mind.  I suspect that he was immobile on his  buttocks for quite some time causing a compressive sciatic neuropathy.   If imaging is negative for spinal process, then I would favor conservative management with physical therapy, AFO brace, and follow-up for EMG.  1) follow-up MRI L-spine and pelvis  Ritta Slot, MD [facility] [phone]  If 7pm- 7am, please page neurology on call as listed in AMION.

## 2018-12-07 NOTE — Progress Notes (Signed)
PT Cancellation Note  Patient Details Name: Craig Woodward MRN: [id number] DOB: 14-Jan-1981   Cancelled Treatment:    Reason Eval/Treat Not Completed: Other (comment).  Pt is awake in bed, lying on his side.  Declines PT again today, but does acknowledge that he needs to be seen for the issues related to foot drop impeding his mobility.  Follow up tomorrow and hopefully will have more testing information as well.   Ramond Dial 12/07/2018, 3:35 PM   Mee Hives, PT MS Acute Rehab Dept. Number: [place]

## 2018-12-08 ENCOUNTER — Inpatient Hospital Stay (HOSPITAL_COMMUNITY): Payer: Self-pay

## 2018-12-08 DIAGNOSIS — R531 Weakness: Secondary | ICD-10-CM

## 2018-12-08 MED ORDER — LORAZEPAM 2 MG/ML IJ SOLN
2.0000 mg | Freq: Once | INTRAMUSCULAR | Status: AC
  Administered 2018-12-08: 2 mg via INTRAVENOUS
  Filled 2018-12-08: qty 1

## 2018-12-08 MED ORDER — CHLORDIAZEPOXIDE HCL 5 MG PO CAPS
5.0000 mg | ORAL_CAPSULE | Freq: Three times a day (TID) | ORAL | Status: AC
  Administered 2018-12-08 (×3): 5 mg via ORAL
  Filled 2018-12-08 (×3): qty 1

## 2018-12-08 MED ORDER — METHADONE HCL 10 MG PO TABS
20.0000 mg | ORAL_TABLET | Freq: Every day | ORAL | Status: DC
  Administered 2018-12-08 – 2018-12-09 (×2): 20 mg via ORAL
  Filled 2018-12-08 (×2): qty 2

## 2018-12-08 MED ORDER — GADOBUTROL 1 MMOL/ML IV SOLN
7.0000 mL | Freq: Once | INTRAVENOUS | Status: AC | PRN
  Administered 2018-12-08: 16:00:00 7 mL via INTRAVENOUS

## 2018-12-08 NOTE — Progress Notes (Signed)
PROGRESS NOTE  Craig Woodward QMV:[id number] DOB: 08-Feb-1981 DOA: 12/04/2018 PCP: Patient, No Pcp Per  HPI/Recap of past 24 hours:  Craig Woodward is a 38 y.o. male with medical history significant of heroin abuse, ADHD, allergy, asthma, back pain presenting to the hospital for evaluation of bilateral leg weakness.  Patient states his legs are weak for the past 2 days from the ankle down and he has not been able to feel his feet.  He has not been able to walk.  Denies saddle anesthesia or bladder/fecal incontinence.  Denies any fevers or chills.  States he snorts heroin, last used 3 days ago.  Patient initially denied back pain but then later stated he had some lower back pain while in the MRI machine.  Denies injury to his back or legs.  No additional history could be obtained from him.  ED Course: Hemodynamically stable.  Patient has been in the ED since 12/04/2018 p.m.  CBC done yesterday showing no leukocytosis.  CMP showing elevated transaminases (AST 99, ALT 53).  Alk phos and T bili normal.  SARS-CoV-2 test negative.  ESR pending.  Blood culture x2 pending.  Noted to have cool feet with absent pedal pulses on the right, diminished on the left.  CTA aortobifemoral negative for occlusion.  Given history of drug abuse, MRI of cervical, thoracic, and lumbar spine was ordered.  2 attempts were made to get MRI done but patient was not able to tolerate it.  The examination was limited and motion degraded.  Patient was unable to complete the axial imaging throughout the thoracic spine and the lumbar spine was not imaged. No significant findings seen in the cervical spine.  Several thoracic disc herniations, largest at T8-9 where there is right paracentral disc extrusion with cephalad extension of disc material.  No cord deformity, foraminal compromise or nerve root encroachment identified.  No evidence of discitis or osteomyelitis. Per ED provider, patient is able to perform straight leg raise but states  he has no strength when he stands up. Case was discussed with Dr. Lorraine Lax from neurology who will consult. Patient received a 500 cc fluid bolus. Other imaging done in the ED: Chest x-ray showing findings consistent with COPD and no active cardiopulmonary disease.  12/08/18: Patient seen and examined at bedside this morning.  No acute events overnight.  He reports 2 loose stools this morning.  Receiving methadone due to concern for opiate withdrawal.  States he feels better today and agreeable to MRI of his lumbar spine.  Admits to 1/2 g heroine per day and about 1 g of benzodiazepine a day for the past 2 to 3 years.   Assessment/Plan: Principal Problem:   Foot drop, bilateral Active Problems:   Anxiety   Abnormal transaminases   Heroin abuse (HCC)   Asthma   Lower extremity weakness  Bilateral foot drop, unclear etiology Neurology has been consulted and following Patient agreeable to lumbar MRI today  If MRI is negative, will need LP per neurology Blood cultures negative x1 day  Polysubstance abuse including cocaine, THC, heroin, and amphetamine. Polysubstance abuse cessation counseling Social worker consulted to assist with resources for polysubstance abuse cessation Patient denies IV drug use, states he was snorting heroine UDS done on 12/06/2018 showed amphetamine, opiates and benzodiazepines QTC obtained no QTC prolongation Increased dose of methadone due to significant use of opiates Methadone 20 mg daily Continue to closely monitor for any signs of withdrawal  Hypophosphatemia Phosphorus 1.9 Repleted with sodium phosphate  Encourage increase oral intake  Elevated LFTs Abdominal ultrasound unrevealing, no gallstones or wall thickening CRP and sed rate negative  History of asthma Stable Continue albuterol as needed  Anxiety Continue Lexapro   DVT prophylaxis: Subcutaneous heparin Code Status: Full code Family Communication: No family available. Disposition Plan:  Anticipate discharge when neurosurgery and neurology sign off. Consults called: Neurosurgery, neurology    Objective: Vitals:   12/07/18 0545 12/07/18 1249 12/07/18 2146 12/08/18 0554  BP: 118/79 112/71 114/68 109/71  Pulse: 68 71 63 69  Resp: '20 14 17 20  '$ Temp: 98.4 F (36.9 C) 97.9 F (36.6 C) 98.3 F (36.8 C) 98.3 F (36.8 C)  TempSrc:  Oral Oral Oral  SpO2: 98% 97% 94% 95%  Weight:      Height:        Intake/Output Summary (Last 24 hours) at 12/08/2018 1311 Last data filed at 12/08/2018 1140 Gross per 24 hour  Intake 120 ml  Output 675 ml  Net -555 ml   Filed Weights   12/06/18 0200 12/07/18 0500  Weight: 70 kg 66.7 kg    Exam:   General: 38 y.o. year-old male well-developed well-nourished no acute distress.  Alert and oriented x3.    Cardiovascular: Regular rate and rhythm no rubs or gallops no JVD or thyromegaly noted.    Respiratory: Clear to auscultation no wheeze or rales. Poor inspiratory effort.  Abdomen: Soft Nontender Nondistended Normal Bowel Sounds Present.   Musculoskeletal: No lower extremity edema.  2 out of 4 pulses in all 4 extremities.  Psychiatry: Mood is appropriate for condition and setting..   Data Reviewed: CBC: Recent Labs  Lab 12/04/18 2241 12/04/18 2305 12/05/18 0940  WBC 8.8  --   --   HGB 15.2 16.0 13.6  HCT 46.5 47.0 40.0  MCV 91.4  --   --   PLT 303  --   --    Basic Metabolic Panel: Recent Labs  Lab 12/04/18 2305 12/05/18 0841 12/05/18 0940 12/06/18 0216  NA 137 136 139  --   K 4.4 3.9 4.8  --   CL 96* 98  --   --   CO2  --  27  --   --   GLUCOSE 150* 132*  --   --   BUN 20 14  --   --   CREATININE 0.90 0.74  --   --   CALCIUM  --  8.9  --   --   MG  --   --   --  2.2  PHOS  --   --   --  1.9*   GFR: Estimated Creatinine Clearance: 120.4 mL/min (by C-G formula based on SCr of 0.74 mg/dL). Liver Function Tests: Recent Labs  Lab 12/05/18 0841  AST 99*  ALT 53*  ALKPHOS 71  BILITOT 0.6  PROT 6.7    ALBUMIN 3.8   No results for input(s): LIPASE, AMYLASE in the last 168 hours. No results for input(s): AMMONIA in the last 168 hours. Coagulation Profile: No results for input(s): INR, PROTIME in the last 168 hours. Cardiac Enzymes: No results for input(s): CKTOTAL, CKMB, CKMBINDEX, TROPONINI in the last 168 hours. BNP (last 3 results) No results for input(s): PROBNP in the last 8760 hours. HbA1C: No results for input(s): HGBA1C in the last 72 hours. CBG: No results for input(s): GLUCAP in the last 168 hours. Lipid Profile: No results for input(s): CHOL, HDL, LDLCALC, TRIG, CHOLHDL, LDLDIRECT in the last 72 hours. Thyroid Function Tests:  No results for input(s): TSH, T4TOTAL, FREET4, T3FREE, THYROIDAB in the last 72 hours. Anemia Panel: No results for input(s): VITAMINB12, FOLATE, FERRITIN, TIBC, IRON, RETICCTPCT in the last 72 hours. Urine analysis:    Component Value Date/Time   COLORURINE YELLOW 12/05/2018 [place] 12/05/2018 1245   LABSPEC 1.030 12/05/2018 1245   PHURINE 5.0 12/05/2018 1245   GLUCOSEU 50 (A) 12/05/2018 1245   HGBUR NEGATIVE 12/05/2018 1245   BILIRUBINUR NEGATIVE 12/05/2018 1245   KETONESUR 5 (A) 12/05/2018 1245   PROTEINUR NEGATIVE 12/05/2018 1245   NITRITE NEGATIVE 12/05/2018 1245   LEUKOCYTESUR NEGATIVE 12/05/2018 1245   Sepsis Labs: '@LABRCNTIP'$ (procalcitonin:4,lacticidven:4)  ) Recent Results (from the past 240 hour(s))  SARS Coronavirus 2 [facility] order, Performed in [facility] hospital lab) Nasopharyngeal Nasopharyngeal Swab     Status: None   Collection Time: 12/05/18  8:43 AM   Specimen: Nasopharyngeal Swab  Result Value Ref Range Status   SARS Coronavirus 2 NEGATIVE NEGATIVE Final    Comment: (NOTE) If result is NEGATIVE SARS-CoV-2 target nucleic acids are NOT DETECTED. The SARS-CoV-2 RNA is generally detectable in upper and lower  respiratory specimens during the acute phase of infection. The lowest  concentration of  SARS-CoV-2 viral copies this assay can detect is 250  copies / mL. A negative result does not preclude SARS-CoV-2 infection  and should not be used as the sole basis for treatment or other  patient management decisions.  A negative result may occur with  improper specimen collection / handling, submission of specimen other  than nasopharyngeal swab, presence of viral mutation(s) within the  areas targeted by this assay, and inadequate number of viral copies  (<250 copies / mL). A negative result must be combined with clinical  observations, patient history, and epidemiological information. If result is POSITIVE SARS-CoV-2 target nucleic acids are DETECTED. The SARS-CoV-2 RNA is generally detectable in upper and lower  respiratory specimens dur ing the acute phase of infection.  Positive  results are indicative of active infection with SARS-CoV-2.  Clinical  correlation with patient history and other diagnostic information is  necessary to determine patient infection status.  Positive results do  not rule out bacterial infection or co-infection with other viruses. If result is PRESUMPTIVE POSTIVE SARS-CoV-2 nucleic acids MAY BE PRESENT.   A presumptive positive result was obtained on the submitted specimen  and confirmed on repeat testing.  While 2019 novel coronavirus  (SARS-CoV-2) nucleic acids may be present in the submitted sample  additional confirmatory testing may be necessary for epidemiological  and / or clinical management purposes  to differentiate between  SARS-CoV-2 and other Sarbecovirus currently known to infect humans.  If clinically indicated additional testing with an alternate test  methodology [id number]) is advised. The SARS-CoV-2 RNA is generally  detectable in upper and lower respiratory sp ecimens during the acute  phase of infection. The expected result is Negative. Fact Sheet for Patients:  StrictlyIdeas.no Fact Sheet for Healthcare  Providers: BankingDealers.co.za This test is not yet approved or cleared by the [place] FDA and has been authorized for detection and/or diagnosis of SARS-CoV-2 by FDA under an Emergency Use Authorization (EUA).  This EUA will remain in effect (meaning this test can be used) for the duration of the COVID-19 declaration under Section 564(b)(1) of the Act, 21 U.S.C. section 360bbb-3(b)(1), unless the authorization is terminated or revoked sooner. Performed at [facility], [place] [address], [place], [place] [address]   Culture, blood (Routine X  2) w Reflex to ID Panel     Status: None (Preliminary result)   Collection Time: 12/06/18  2:26 AM   Specimen: BLOOD  Result Value Ref Range Status   Specimen Description BLOOD LEFT ANTECUBITAL  Final   Special Requests   Final    BOTTLES DRAWN AEROBIC AND ANAEROBIC Blood Culture adequate volume   Culture   Final    NO GROWTH 2 DAYS Performed at [facility], [address], [place], [place] [address]    Report Status PENDING  Incomplete  Culture, blood (Routine X 2) w Reflex to ID Panel     Status: None (Preliminary result)   Collection Time: 12/06/18  2:28 AM   Specimen: BLOOD  Result Value Ref Range Status   Specimen Description BLOOD LEFT ANTECUBITAL  Final   Special Requests   Final    BOTTLES DRAWN AEROBIC AND ANAEROBIC Blood Culture adequate volume   Culture   Final    NO GROWTH 2 DAYS Performed at [facility], [place] [address], [place],  [address]    Report Status PENDING  Incomplete      Studies: No results found.  Scheduled Meds:  chlordiazePOXIDE  5 mg Oral TID   escitalopram  20 mg Oral Daily   heparin  5,000 Units Subcutaneous Q8H   methadone  20 mg Oral Daily    Continuous Infusions:   LOS: 2 days     Kayleen Memos, MD [facility] Pager [phone]  If 7PM-7AM, please contact night-coverage [email or web address] Password [email or web address] 12/08/2018, 1:11 PM

## 2018-12-08 NOTE — Progress Notes (Signed)
NEUROLOGY PROGRESS NOTE  Subjective: Still having weakness of the lower extremities.  Awaiting MRI.  Exam: Vitals:   12/07/18 2146 12/08/18 0554  BP: 114/68 109/71  Pulse: 63 69  Resp: 17 20  Temp: 98.3 F (36.8 C) 98.3 F (36.8 C)  SpO2: 94% 95%    Physical Exam   HEENT-  Normocephalic, no lesions, without obvious abnormality.  Normal external eye and conjunctiva.   Extremities- Warm, dry and intact Musculoskeletal-no joint tenderness, deformity or swelling Skin-warm and dry, no hyperpigmentation, vitiligo, or suspicious lesions    Neuro:  Mental Status: Alert, oriented, thought content appropriate.  Speech fluent without evidence of aphasia.  Able to follow 3 step commands without difficulty. Cranial Nerves: II:  Visual fields grossly normal,  III,IV, VI: ptosis not present, extra-ocular motions intact bilaterally pupils equal, round, reactive to light and accommodation V,VII: smile symmetric, facial light touch sensation normal bilaterally VIII: hearing normal bilaterally IX,X: Palate rises midline XI: bilateral shoulder shrug XII: midline tongue extension Motor: Right : Upper extremity   5/5    Left:     Upper extremity   5/5 - Bilateral hip flexion 5/5, bilateral knee extension 5/5, bilateral knee flexion 4/5, bilateral plantar flexion 5/5, bilateral dorsiflexion 0/5 Tone and bulk:normal tone throughout; no atrophy noted Sensory: Pinprick and light touch intact throughout with decreased sensation up to knee level Deep Tendon Reflexes: 2+ and symmetric throughout Plantars: Downgoing bilaterally Cerebellar: Normal finger-to-nose bilaterally in the upper extremities     Medications:  Prior to Admission:  Medications Prior to Admission  Medication Sig Dispense Refill Last Dose  . acetaminophen (TYLENOL) 500 MG tablet Take 1,000 mg by mouth every 6 (six) hours as needed for moderate pain.   Past Week at Unknown time  . albuterol (PROVENTIL HFA;VENTOLIN HFA) 108  (90 Base) MCG/ACT inhaler Inhale 2 puffs into the lungs every 6 (six) hours as needed for wheezing or shortness of breath. 1 Inhaler 0 Past Week at Unknown time  . albuterol (PROVENTIL) (2.5 MG/3ML) 0.083% nebulizer solution Take 3 mLs (2.5 mg total) by nebulization every 6 (six) hours as needed for wheezing or shortness of breath. 75 mL 1 Past Week at Unknown time  . escitalopram (LEXAPRO) 20 MG tablet Take 20 mg by mouth daily.   12/04/2018 at Unknown time  . naloxone (NARCAN) nasal spray 4 mg/0.1 mL Use in the event of suspected overdose 1 kit 0 unknown  . amoxicillin (AMOXIL) 500 MG capsule Take 1 capsule (500 mg total) by mouth 3 (three) times daily. (Patient not taking: Reported on 12/05/2018) 21 capsule 0 Completed Course at Unknown time  . erythromycin with ethanol (EMGEL) 2 % gel Apply topically daily. (Patient not taking: Reported on 12/05/2018) 30 g 0 Completed Course at Unknown time  . ibuprofen (ADVIL,MOTRIN) 600 MG tablet Take 1 tablet (600 mg total) by mouth 3 (three) times daily after meals. (Patient not taking: Reported on 12/05/2018) 30 tablet 0 Completed Course at Unknown time  . methocarbamol (ROBAXIN) 500 MG tablet Take 2 tablets (1,000 mg total) by mouth every 8 (eight) hours as needed for muscle spasms. (Patient not taking: Reported on 12/05/2018) 30 tablet 0 Completed Course at Unknown time  . predniSONE (DELTASONE) 20 MG tablet Take 2 tablets (40 mg total) by mouth daily. (Patient not taking: Reported on 12/05/2018) 10 tablet 0 Not Taking at Unknown time   Scheduled: . chlordiazePOXIDE  5 mg Oral TID  . escitalopram  20 mg Oral Daily  . heparin  5,000 Units Subcutaneous Q8H  . methadone  20 mg Oral Daily     Pertinent Labs/Diagnostics: Awaiting lumbar spine MRI     Etta Quill PA-C [facility] [phone]   Assessment: 38 year old male with history of heroin abuse presents with sudden onset of back pain along with bilateral foot drop, lower extremity weakness  with bilateral lower extremity numbness and tingling.  At this point given the fact that we do not have a MRI of the lumbar spine differential includes conus medullaris.  in addition cauda equina also is a possibility however he has no bowel or bladder incontinence and he does not have brisk reflexes.  Recommendations: -Follow-up MRI of lumbar spine and pelvis. - Patient will need AFO braces - Patient will need follow-up EMG as an outpatient.    12/08/2018, 11:05 AM

## 2018-12-08 NOTE — Progress Notes (Signed)
PT Cancellation Note  Patient Details Name: Craig Woodward MRN: [id number] DOB: 1980-09-16   Cancelled Treatment:    Reason Eval/Treat Not Completed: Patient declined, no reason specified. Explained cancellation policy as this is pt's third day of refusing PT Evaluation. Will sign-off, pt reports understanding and would like to be removed from our caseload. Please reconsult if new needs arise and pt desires to participate.  Mabeline Caras, PT, DPT [facility] Services  Pager [phone] Office [place] 12/08/2018, 9:50 AM

## 2018-12-08 NOTE — Progress Notes (Signed)
CSW met with the patient at bedside. CSW provided the patient with substance abuse resources. The patient did not have any other questions or concerns.   CSW thanked the patient for his time.   CSW is signing off at this time. Please re-consult should any new needs arise.   Domenic Schwab, MSW, [place] [facility]  [phone]

## 2018-12-09 LAB — CBC
HCT: 45 % (ref 39.0–52.0)
Hemoglobin: 14.7 g/dL (ref 13.0–17.0)
MCH: 29.5 pg (ref 26.0–34.0)
MCHC: 32.7 g/dL (ref 30.0–36.0)
MCV: 90.4 fL (ref 80.0–100.0)
Platelets: 256 10*3/uL (ref 150–400)
RBC: 4.98 MIL/uL (ref 4.22–5.81)
RDW: 13 % (ref 11.5–15.5)
WBC: 10.9 10*3/uL — ABNORMAL HIGH (ref 4.0–10.5)
nRBC: 0 % (ref 0.0–0.2)

## 2018-12-09 LAB — BASIC METABOLIC PANEL
Anion gap: 6 (ref 5–15)
BUN: 17 mg/dL (ref 6–20)
CO2: 30 mmol/L (ref 22–32)
Calcium: 8.5 mg/dL — ABNORMAL LOW (ref 8.9–10.3)
Chloride: 104 mmol/L (ref 98–111)
Creatinine, Ser: 0.92 mg/dL (ref 0.61–1.24)
GFR calc Af Amer: >60 mL/min (ref >60)
GFR calc non Af Amer: >60 mL/min (ref >60)
Glucose, Bld: 143 mg/dL — ABNORMAL HIGH (ref 70–99)
Potassium: 3.2 mmol/L — ABNORMAL LOW (ref 3.5–5.1)
Sodium: 140 mmol/L (ref 135–145)

## 2018-12-09 LAB — HEPATITIS PANEL, ACUTE
HCV Ab: 0.1 s/co ratio (ref 0.0–0.9)
Hep A IgM: NEGATIVE
Hep B C IgM: NEGATIVE
Hepatitis B Surface Ag: NEGATIVE

## 2018-12-09 MED ORDER — POTASSIUM CHLORIDE CRYS ER 20 MEQ PO TBCR
40.0000 meq | EXTENDED_RELEASE_TABLET | Freq: Two times a day (BID) | ORAL | Status: DC
  Administered 2018-12-09: 10:00:00 40 meq via ORAL
  Filled 2018-12-09: qty 2

## 2018-12-09 NOTE — Progress Notes (Addendum)
NEUROLOGY PROGRESS NOTE  Subjective: Patient laying in bed comfortably.  He is adamant about going home.  He states that he has been refusing physical therapy for 3 times because he wants to go home.  I had a discussion with him that at least he should see the physical therapist so they can send him home with the correct equipment so that he does not fall.  He is in agreement at this point in time to have physical therapy work with him.  Exam: Vitals:   12/08/18 2127 12/09/18 0440  BP: 119/76 122/77  Pulse: 75 67  Resp:    Temp:  97.6 F (36.4 C)  SpO2: 93% 93%    Physical Exam   HEENT-  Normocephalic, no lesions, without obvious abnormality.  Normal external eye and conjunctiva.   Extremities- Warm, dry and intact Musculoskeletal-no joint tenderness, deformity or swelling Skin-warm and dry, no hyperpigmentation, vitiligo, or suspicious lesions    Neuro:  Mental Status: Alert, oriented, thought content appropriate.  Speech fluent without evidence of aphasia.  Able to follow 3 step commands without difficulty. Cranial Nerves: II:  Visual fields grossly normal,  III,IV, VI: ptosis not present, extra-ocular motions intact bilaterally pupils equal, round, reactive to light and accommodation V,VII: smile symmetric, facial light touch sensation normal bilaterally VIII: hearing normal bilaterally IX,X: Palate rises midline XI: bilateral shoulder shrug XII: midline tongue extension Motor: Upper extremities 5/5.  Bilateral knee extension 5/5, bilateral knee flexion 4/5.  On the left foot he is unable to evert his ankle but has 5/5 strength inverting the ankle.  On the right ankle he has 5/5 everting his ankle but cannot invert his ankle.  Patient has 4/5 plantarflexion and 0/5 with dorsiflexion Sensory: Still has decreased sensation up to the mid calf Deep Tendon Reflexes: 2+ and symmetric throughout upper extremities.  2+ and brisk at knee jerk. Plantars: Right: downgoing   Left:  downgoing Cerebellar: normal finger-to-nose,      Medications:  Scheduled:  escitalopram  20 mg Oral Daily   heparin  5,000 Units Subcutaneous Q8H   methadone  20 mg Oral Daily   potassium chloride  40 mEq Oral BID   Continuous:    Pertinent Labs/Diagnostics:   Mr Lumbar Spine W Wo Contrast  Result Date: 12/08/2018 CLINICAL DATA:  Weakness of the lower extremities EXAM:IMPRESSION: 1. Intramuscular edema and enhancement of the posterior paraspinal musculature extending from approximately L2-3 through L5-S1 suggesting a non-specific myositis. No intramuscular fluid collections to suggest abscess or myonecrosis. 2. Mild degenerative disc disease of L4-5 and L5-S1 resulting in mild left foraminal stenosis and borderline right subarticular recess stenosis at the L5-S1 level. 3. No evidence of discitis-osteomyelitis. Electronically Signed   By: Duanne GuessNicholas  Plundo M.D.   On: 12/08/2018 16:23   Mr Pelvis W Wo Contrast  Result Date: 12/08/2018 CLINICAL DATA:  Bilateral lower extremity weakness EXAM: MRI PELVIS WITHOUT AND WITH CONTRAST TECHNIQUE:IMPRESSION: 1. Intramuscular edema and enhancement of the lower lumbar posterior paraspinal musculature and an additional focal area within the right gluteus medius. Findings suggest a nonspecific myositis. 2. No acute findings within the pelvis. 3. No bony abnormality or hip joint effusion. Electronically Signed   By: Duanne GuessNicholas  Plundo M.D.   On: 12/08/2018 16:44     Felicie MornDavid Smith PA-C [facility] [phone]   Assessment: 38 year old male with history of heroin abuse presenting to the hospital sudden onset of back pain along with bilateral foot drop, lower extremity weakness and bilateral lower extremity numbness and  tingling.  MRI of lumbar spine and pelvis does intramuscular edema and enhancement of posterior paraspinal muscles extending from L2 down to S1.  This is suggestive of nonspecific myositis.  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   Recommendations: -PT/OT - Will need AFO braces bilaterally -Will need outpatient EMG nerve conduction study in 4 to 6 weeks as an outpatient. -Would recommend adding the case with neurosurgery to see if any acute intervention might be recommended, although my suspicion is that there is not much role for that at this time.   12/09/2018, 9:49 AM

## 2018-12-09 NOTE — Progress Notes (Signed)
CSW received consult from RN regarding assistance with methadone appointment. CSW contacted patient. He reported that the previous CSW did give him the agencies that offer methadone and he is aware that he has to call to set up an intake appointment with their admissions coordinator. CSW strongly advised that he go ahead and call so that he will not experience any withdrawal symptoms. CSW suggested [facility] may be easier to get into as they do not require as rigorous an intake process, though [facility] is able to help with no-cost methadone. Patient reports understanding and expresses appreciation.   Craig Locus Metztli Sachdev LCSW [phone]

## 2018-12-09 NOTE — Progress Notes (Signed)
Patient decided to leave AMA, did not want to wait for PT/OT eval in the morning. AFO boots delivered to patient and follow up appointment information given. AMA paper signed and MD notified.

## 2018-12-09 NOTE — Progress Notes (Addendum)
Orthopedic Tech Progress Note Patient Details:  Craig Woodward February 02, 1981 [id number] Called order to [facility] for bilateral AFO's. patient is being discharged so I asked for that order to be STAT. But they close @5  so patient may not have AFO's until tomorrow. Patient ID: Craig Woodward, male   DOB: Aug 07, 1980, 38 y.o.   MRN: [id number]   Craig Woodward 12/09/2018, 3:13 PM

## 2018-12-09 NOTE — Progress Notes (Signed)
PROGRESS NOTE  Craig Woodward WER:[id number] DOB: 1980-11-01 DOA: 12/04/2018 PCP: Patient, No Pcp Per  HPI/Recap of past 24 hours:  Craig Woodward is a 38 y.o. male with medical history significant of heroin abuse, ADHD, allergy, asthma, back pain presenting to the hospital for evaluation of bilateral leg weakness.  Patient states his legs are weak for the past 2 days from the ankle down and he has not been able to feel his feet.  He has not been able to walk.  Denies saddle anesthesia or bladder/fecal incontinence.  Denies any fevers or chills.  States he snorts heroin, last used 3 days ago.  Patient initially denied back pain but then later stated he had some lower back pain while in the MRI machine.  Denies injury to his back or legs.  No additional history could be obtained from him.  ED Course: Hemodynamically stable.  Patient has been in the ED since 12/04/2018 p.m.  CBC done yesterday showing no leukocytosis.  CMP showing elevated transaminases (AST 99, ALT 53).  Alk phos and T bili normal.  SARS-CoV-2 test negative.  ESR pending.  Blood culture x2 pending.  Noted to have cool feet with absent pedal pulses on the right, diminished on the left.  CTA aortobifemoral negative for occlusion.  Given history of drug abuse, MRI of cervical, thoracic, and lumbar spine was ordered.  2 attempts were made to get MRI done but patient was not able to tolerate it.  The examination was limited and motion degraded.  Patient was unable to complete the axial imaging throughout the thoracic spine and the lumbar spine was not imaged. No significant findings seen in the cervical spine.  Several thoracic disc herniations, largest at T8-9 where there is right paracentral disc extrusion with cephalad extension of disc material.  No cord deformity, foraminal compromise or nerve root encroachment identified.  No evidence of discitis or osteomyelitis. Per ED provider, patient is able to perform straight leg raise but states  he has no strength when he stands up. Case was discussed with Dr. Lorraine Lax from neurology who will consult. Patient received a 500 cc fluid bolus. Other imaging done in the ED: Chest x-ray showing findings consistent with COPD and no active cardiopulmonary disease.  12/09/18: Patient was seen and examined at his bedside this morning.  No acute events overnight.  Frequent stools are resolving.  No sign of withdrawal on Librium and methadone.  Persistent bilateral foot drop.  Lumbar spine/pelvis MRI completed on 12/08/2018 showing nonspecific myositis.  Neurology following.  Assessment/Plan: Principal Problem:   Foot drop, bilateral Active Problems:   Anxiety   Abnormal transaminases   Heroin abuse (HCC)   Asthma   Lower extremity weakness  Bilateral foot drop, unclear etiology Neurology has been consulted and following Lumbar spine/pelvis MRI completed on 12/08/2018 showing nonspecific myositis. No evidence of abscess or myonecrosis, no evidence of discitis or osteomyelitis Blood cultures no growth x2 days Defer further management to neurology  Polysubstance abuse including cocaine, THC, heroin, and amphetamine. Polysubstance abuse cessation counseling Social worker consulted to assist with resources for polysubstance abuse cessation Case manager consulted to assist in setting up appointment with methadone clinic for DC planning. Patient denies IV drug use, states he was snorting heroine UDS done on 12/06/2018 showed amphetamine, opiates and benzodiazepines QTC obtained no QTC prolongation Currently on methadone 20 mg daily and Lexapro 20 mg daily  Hypokalemia Potassium 3.2 Repleted with KCl p.o. 40 mEq x 2 doses Magnesium  2.2 on 12/06/2018.  Hypophosphatemia Phosphorus 1.9 Repleted with sodium phosphate Encourage increase oral intake  Elevated LFTs Abdominal ultrasound unrevealing, no gallstones or wall thickening CRP and sed rate negative Obtain acute hepatitis panel  History of  asthma Stable Continue albuterol as needed  Anxiety Continue Lexapro   DVT prophylaxis: Subcutaneous heparin Code Status: Full code Family Communication: No family available. Disposition Plan: Anticipate discharge when methadone clinic follow-up appointment has been established or when neurosurgery and neurology sign off. Consults called: Neurosurgery, neurology    Objective: Vitals:   12/08/18 1338 12/08/18 1557 12/08/18 2127 12/09/18 0440  BP: 120/69 101/77 119/76 122/77  Pulse: 72 82 75 67  Resp:      Temp: 97.6 F (36.4 C)   97.6 F (36.4 C)  TempSrc:    Oral  SpO2: 95% 94% 93% 93%  Weight:      Height:        Intake/Output Summary (Last 24 hours) at 12/09/2018 0919 Last data filed at 12/08/2018 1559 Gross per 24 hour  Intake 120 ml  Output --  Net 120 ml   Filed Weights   12/06/18 0200 12/07/18 0500  Weight: 70 kg 66.7 kg    Exam:   General: 38 y.o. year-old male developed well-nourished in no acute distress.  Alert and oriented x3.    Cardiovascular: Regular rhythm no rubs or gallops.  No JVD or thyromegaly noted.  Respiratory: Clear to auscultation no wheezes or rales.  Good inspiratory effort.   Abdomen: Soft nontender nondistended normal bowel sounds present.  Musculoskeletal: No lower extremity edema.  2 out of 4 pulses in all 4 extremities.    Psychiatry: Mood is appropriate for condition and setting.   Data Reviewed: CBC: Recent Labs  Lab 12/04/18 2241 12/04/18 2305 12/05/18 0940 12/09/18 0613  WBC 8.8  --   --  10.9*  HGB 15.2 16.0 13.6 14.7  HCT 46.5 47.0 40.0 45.0  MCV 91.4  --   --  90.4  PLT 303  --   --  683   Basic Metabolic Panel: Recent Labs  Lab 12/04/18 2305 12/05/18 0841 12/05/18 0940 12/06/18 0216 12/09/18 0613  NA 137 136 139  --  140  K 4.4 3.9 4.8  --  3.2*  CL 96* 98  --   --  104  CO2  --  27  --   --  30  GLUCOSE 150* 132*  --   --  143*  BUN 20 14  --   --  17  CREATININE 0.90 0.74  --   --  0.92   CALCIUM  --  8.9  --   --  8.5*  MG  --   --   --  2.2  --   PHOS  --   --   --  1.9*  --    GFR: Estimated Creatinine Clearance: 104.7 mL/min (by C-G formula based on SCr of 0.92 mg/dL). Liver Function Tests: Recent Labs  Lab 12/05/18 0841  AST 99*  ALT 53*  ALKPHOS 71  BILITOT 0.6  PROT 6.7  ALBUMIN 3.8   No results for input(s): LIPASE, AMYLASE in the last 168 hours. No results for input(s): AMMONIA in the last 168 hours. Coagulation Profile: No results for input(s): INR, PROTIME in the last 168 hours. Cardiac Enzymes: No results for input(s): CKTOTAL, CKMB, CKMBINDEX, TROPONINI in the last 168 hours. BNP (last 3 results) No results for input(s): PROBNP in the last 8760 hours. HbA1C: No  results for input(s): HGBA1C in the last 72 hours. CBG: No results for input(s): GLUCAP in the last 168 hours. Lipid Profile: No results for input(s): CHOL, HDL, LDLCALC, TRIG, CHOLHDL, LDLDIRECT in the last 72 hours. Thyroid Function Tests: No results for input(s): TSH, T4TOTAL, FREET4, T3FREE, THYROIDAB in the last 72 hours. Anemia Panel: No results for input(s): VITAMINB12, FOLATE, FERRITIN, TIBC, IRON, RETICCTPCT in the last 72 hours. Urine analysis:    Component Value Date/Time   COLORURINE YELLOW 12/05/2018 [place] 12/05/2018 1245   LABSPEC 1.030 12/05/2018 1245   PHURINE 5.0 12/05/2018 1245   GLUCOSEU 50 (A) 12/05/2018 1245   HGBUR NEGATIVE 12/05/2018 1245   BILIRUBINUR NEGATIVE 12/05/2018 1245   KETONESUR 5 (A) 12/05/2018 1245   PROTEINUR NEGATIVE 12/05/2018 1245   NITRITE NEGATIVE 12/05/2018 1245   LEUKOCYTESUR NEGATIVE 12/05/2018 1245   Sepsis Labs: _0 (procalcitonin:4,lacticidven:4)  ) Recent Results (from the past 240 hour(s))  SARS Coronavirus 2 [facility] order, Performed in [facility] hospital lab) Nasopharyngeal Nasopharyngeal Swab     Status: None   Collection Time: 12/05/18  8:43 AM   Specimen: Nasopharyngeal Swab  Result Value  Ref Range Status   SARS Coronavirus 2 NEGATIVE NEGATIVE Final    Comment: (NOTE) If result is NEGATIVE SARS-CoV-2 target nucleic acids are NOT DETECTED. The SARS-CoV-2 RNA is generally detectable in upper and lower  respiratory specimens during the acute phase of infection. The lowest  concentration of SARS-CoV-2 viral copies this assay can detect is 250  copies / mL. A negative result does not preclude SARS-CoV-2 infection  and should not be used as the sole basis for treatment or other  patient management decisions.  A negative result may occur with  improper specimen collection / handling, submission of specimen other  than nasopharyngeal swab, presence of viral mutation(s) within the  areas targeted by this assay, and inadequate number of viral copies  (<250 copies / mL). A negative result must be combined with clinical  observations, patient history, and epidemiological information. If result is POSITIVE SARS-CoV-2 target nucleic acids are DETECTED. The SARS-CoV-2 RNA is generally detectable in upper and lower  respiratory specimens dur ing the acute phase of infection.  Positive  results are indicative of active infection with SARS-CoV-2.  Clinical  correlation with patient history and other diagnostic information is  necessary to determine patient infection status.  Positive results do  not rule out bacterial infection or co-infection with other viruses. If result is PRESUMPTIVE POSTIVE SARS-CoV-2 nucleic acids MAY BE PRESENT.   A presumptive positive result was obtained on the submitted specimen  and confirmed on repeat testing.  While 2019 novel coronavirus  (SARS-CoV-2) nucleic acids may be present in the submitted sample  additional confirmatory testing may be necessary for epidemiological  and / or clinical management purposes  to differentiate between  SARS-CoV-2 and other Sarbecovirus currently known to infect humans.  If clinically indicated additional testing with an  alternate test  methodology [id number]) is advised. The SARS-CoV-2 RNA is generally  detectable in upper and lower respiratory sp ecimens during the acute  phase of infection. The expected result is Negative. Fact Sheet for Patients:  StrictlyIdeas.no Fact Sheet for Healthcare Providers: BankingDealers.co.za This test is not yet approved or cleared by the [place] FDA and has been authorized for detection and/or diagnosis of SARS-CoV-2 by FDA under an Emergency Use Authorization (EUA).  This EUA will remain in effect (meaning this test can be used) for the duration of  the COVID-19 declaration under Section 564(b)(1) of the Act, 21 U.S.C. section 360bbb-3(b)(1), unless the authorization is terminated or revoked sooner. Performed at [facility], [place] [address] [place], [place] [address]   Culture, blood (Routine X 2) w Reflex to ID Panel     Status: None (Preliminary result)   Collection Time: 12/06/18  2:26 AM   Specimen: BLOOD  Result Value Ref Range Status   Specimen Description BLOOD LEFT ANTECUBITAL  Final   Special Requests   Final    BOTTLES DRAWN AEROBIC AND ANAEROBIC Blood Culture adequate volume   Culture   Final    NO GROWTH 3 DAYS Performed at [facility] [address] [place], [place] [address]    Report Status PENDING  Incomplete  Culture, blood (Routine X 2) w Reflex to ID Panel     Status: None (Preliminary result)   Collection Time: 12/06/18  2:28 AM   Specimen: BLOOD  Result Value Ref Range Status   Specimen Description BLOOD LEFT ANTECUBITAL  Final   Special Requests   Final    BOTTLES DRAWN AEROBIC AND ANAEROBIC Blood Culture adequate volume   Culture   Final    NO GROWTH 3 DAYS Performed at [facility], [place] [address] [place], [place] [address]    Report Status PENDING  Incomplete      Studies: Mr Lumbar Spine W Wo Contrast  Result Date: 12/08/2018 CLINICAL DATA:  Weakness of  the lower extremities EXAM: MRI LUMBAR SPINE WITHOUT AND WITH CONTRAST TECHNIQUE: Multiplanar and multiecho pulse sequences of the lumbar spine were obtained without and with intravenous contrast. CONTRAST:  7 mL Gadavist IV contrast COMPARISON:  None. FINDINGS: Segmentation:  Standard. Alignment:  Physiologic. Vertebrae: No fracture, evidence of discitis, or bone lesion. Discogenic endplate marrow changes at the L4-5 and L5-S1 levels. Endplates are preserved. No fluid signal within the disc or surrounding inflammatory changes to suggest discitis. Conus medullaris and cauda equina: Conus extends to the L1 level. Conus and cauda equina appear normal. Paraspinal and other soft tissues: There is intramuscular edema and enhancement within the bilateral paraspinal musculature of the low back extending from approximately the L2-3 through L5-S1 levels. Disc levels: L1-L2: No significant disc protrusion, foraminal stenosis, or canal stenosis. L2-L3: No significant disc protrusion, foraminal stenosis, or canal stenosis. L3-L4: No significant disc protrusion, foraminal stenosis, or canal stenosis. L4-L5: Mild diffuse disc bulge and mild bilateral facet arthrosis. No foraminal or canal stenosis. L5-S1: Right paracentral disc protrusion and mild bilateral facet arthrosis result in mild left foraminal stenosis. No canal stenosis. There is borderline narrowing of the right subarticular recess without definite impingement of the descending right S1 nerve root. IMPRESSION: 1. Intramuscular edema and enhancement of the posterior paraspinal musculature extending from approximately L2-3 through L5-S1 suggesting a non-specific myositis. No intramuscular fluid collections to suggest abscess or myonecrosis. 2. Mild degenerative disc disease of L4-5 and L5-S1 resulting in mild left foraminal stenosis and borderline right subarticular recess stenosis at the L5-S1 level. 3. No evidence of discitis-osteomyelitis. Electronically Signed   By:  Davina Poke M.D.   On: 12/08/2018 16:23   Mr Pelvis W Wo Contrast  Result Date: 12/08/2018 CLINICAL DATA:  Bilateral lower extremity weakness EXAM: MRI PELVIS WITHOUT AND WITH CONTRAST TECHNIQUE: Multiplanar multisequence MR imaging of the pelvis was performed both before and after administration of intravenous contrast. CONTRAST:  7 mL Gadavist COMPARISON:  CT 04/01/2007 FINDINGS: Urinary Tract: Unremarkable urinary bladder without wall  thickening. No dilation of the distal ureters. Bowel:  Unremarkable visualized pelvic bowel loops. Vascular/Lymphatic: Mildly prominent bilateral inguinal lymph nodes. No pathologically enlarged lymph nodes within the pelvis. No significant vascular abnormality seen. Reproductive:  Unremarkable prostate. Other:  None. Musculoskeletal: Focal area of edema and enhancement within the right gluteus medius muscle (series 21, image 24). No hip joint effusion. Small focus of subcortical marrow edema within the lateral aspect of the left femoral neck, likely reactive/degenerative. Partially visualized intramuscular edema within the bilateral paraspinal musculature of the lower lumbar region, as seen on concurrent lumbar spine MRI. IMPRESSION: 1. Intramuscular edema and enhancement of the lower lumbar posterior paraspinal musculature and an additional focal area within the right gluteus medius. Findings suggest a nonspecific myositis. 2. No acute findings within the pelvis. 3. No bony abnormality or hip joint effusion. Electronically Signed   By: Davina Poke M.D.   On: 12/08/2018 16:44    Scheduled Meds:  escitalopram  20 mg Oral Daily   heparin  5,000 Units Subcutaneous Q8H   methadone  20 mg Oral Daily    Continuous Infusions:   LOS: 3 days     Kayleen Memos, MD [facility] Pager [phone]  If 7PM-7AM, please contact night-coverage [email or web address] Password [facility] 12/09/2018, 9:19 AM

## 2018-12-09 NOTE — TOC Initial Note (Signed)
Transition of Care [facility]) - Initial/Assessment Note    Patient Details  Name: Craig Woodward MRN: [id number] Date of Birth: 10-23-1980  [facility] CM/SW Contact:    Bartholomew Crews, RN Phone Number: [phone] 12/09/2018, 4:03 PM  Clinical Narrative:                 Consult received for methadone clinic. Information for ADS added to AVS. No PCP - hospital f/u appointment scheduled at [facility] for 9/29 at 9:30am. Appointment time has been added to AVS.   Expected Discharge Plan: Home/Self Care Barriers to Discharge: No Barriers Identified   Patient Goals and CMS Choice        Expected Discharge Plan and Services Expected Discharge Plan: Home/Self Care                                              Prior Living Arrangements/Services                       Activities of Daily Living Home Assistive Devices/Equipment: None ADL Screening (condition at time of admission) Patient's cognitive ability adequate to safely complete daily activities?: Yes Is the patient deaf or have difficulty hearing?: No Does the patient have difficulty seeing, even when wearing glasses/contacts?: No Does the patient have difficulty concentrating, remembering, or making decisions?: No Patient able to express need for assistance with ADLs?: Yes Does the patient have difficulty dressing or bathing?: No Independently performs ADLs?: Yes (appropriate for developmental age) Does the patient have difficulty walking or climbing stairs?: Yes Weakness of Legs: Both Weakness of Arms/Hands: None  Permission Sought/Granted                  Emotional Assessment              Admission diagnosis:  Weakness [R53.1] Patient Active Problem List   Diagnosis Date Noted  . Foot drop, bilateral 12/06/2018  . Abnormal transaminases 12/06/2018  . Heroin abuse ([place]) 12/06/2018  . Asthma 12/06/2018  . Lower extremity weakness 12/06/2018  . ADHD (attention  deficit hyperactivity disorder) 12/31/2011  . Anxiety 12/31/2011   PCP:  Patient, No Pcp Per Pharmacy:   [facility], [place] - [address], [address], [place] [address] Phone: [phone] Fax: [phone]  [id number] - [place], [place] [address] [place] [address] Phone: [phone] Fax: [phone]     Social Determinants of Health (SDOH) Interventions    Readmission Risk Interventions No flowsheet data found.

## 2018-12-09 NOTE — Plan of Care (Signed)
  Problem: Health Behavior/Discharge Planning: Goal: Ability to manage health-related needs will improve Outcome: Progressing   Problem: Clinical Measurements: Goal: Ability to maintain clinical measurements within normal limits will improve Outcome: Progressing   Problem: Activity: Goal: Risk for activity intolerance will decrease Outcome: Progressing   Problem: Coping: Goal: Level of anxiety will decrease Outcome: Progressing   

## 2018-12-10 NOTE — Discharge Summary (Signed)
Discharge Summary  Craig Woodward:[id number] DOB: 1981-03-26  PCP: Patient, No Pcp Per  Admit date: 12/04/2018 Discharge date: 12/10/2018  Time spent: 25 minutes  Recommendations for Outpatient Follow-up:  1. Left AMA  Discharge Diagnoses:  Active Hospital Problems   Diagnosis Date Noted   Foot drop, bilateral 12/06/2018   Abnormal transaminases 12/06/2018   Heroin abuse ([place]) 12/06/2018   Asthma 12/06/2018   Lower extremity weakness 12/06/2018   Anxiety 12/31/2011    Resolved Hospital Problems  No resolved problems to display.      Vitals:   12/09/18 0440 12/09/18 1300  BP: 122/77   Pulse: 67 70  Resp:    Temp: 97.6 F (36.4 C) 98.5 F (36.9 C)  SpO2: 93%     History of present illness:  Craig Woodward a 38 y.o.malewith medical history significant ofheroin abuse, ADHD, allergy, asthma, back pain presenting to the hospital for evaluation of bilateral leg weakness.Patient states his legs are weak for the past 2 days from the ankle down and he has not been able to feel his feet. He has not been able to walk. Denies saddle anesthesia or bladder/fecal incontinence. Denies any fevers or chills. States he snorts heroin, last used 3 days ago. Patient initially denied back pain but then later stated he had some lower back pain while in the MRI machine.Denies injury to his back or legs. No additional history could be obtained from him.  ED Course:Hemodynamically stable. Patient has been in the ED since 12/04/2018 p.m. CBC done yesterday showing no leukocytosis. CMP showing elevated transaminases (AST 99, ALT 53). Alk phos and T bili normal. SARS-CoV-2 test negative. ESR pending. Blood culture x2 pending. Noted to have cool feet with absent pedal pulses on the right, diminished on the left. CTA aortobifemoral negative for occlusion. Given history of drug abuse, MRI of cervical, thoracic, and lumbar spine was ordered. 2 attempts were made to get  MRI done but patient was not able to tolerate it. The examination was limited and motion degraded. Patient was unable to complete the axial imaging throughout the thoracic spine and the lumbar spine was not imaged. No significant findings seen in the cervical spine. Several thoracic disc herniations, largest at T8-9 where there is right paracentral disc extrusion with cephalad extension of disc material. No cord deformity, foraminal compromise or nerve root encroachment identified. No evidence of discitis or osteomyelitis. Per ED provider, patient is able to perform straight leg raise but states he has no strength when he stands up. Case was discussed with Dr. Merrily Pew neurology who will consult. Patient received a 500 cc fluid bolus. Other imaging done in the ED: Chest x-ray showing findings consistent with COPD and no active cardiopulmonary disease.  12/09/18: Patient was seen and examined at his bedside this morning.  No acute events overnight.  Frequent stools are resolving.  No sign of withdrawal on Librium and methadone.  Persistent bilateral foot drop.  Lumbar spine/pelvis MRI completed on 12/08/2018 showing nonspecific myositis.  Neurology following.   PATIENT LEFT AGAINST MEDICAL ADVICE.   Hospital Course:  Principal Problem:   Foot drop, bilateral Active Problems:   Anxiety   Abnormal transaminases   Heroin abuse ([facility])   Asthma   Lower extremity weakness   Bilateral foot drop, unclear etiology Neurology has been consulted and following Lumbar spine/pelvis MRI completed on 12/08/2018 showing nonspecific myositis. No evidence of abscess or myonecrosis, no evidence of discitis or osteomyelitis Blood cultures no growth x2 days Defer further  management to neurology  Polysubstance abuse including cocaine, THC, heroin, and amphetamine. Polysubstance abuse cessation counseling Social worker consulted to assist with resources for polysubstance abuse cessation Case manager  consulted to assist in setting up appointment with methadone clinic for DC planning. Patient denies IV drug use, states he was snorting heroine UDS done on 12/06/2018 showed amphetamine, opiates and benzodiazepines QTC obtained no QTC prolongation Currently on methadone 20 mg daily and Lexapro 20 mg daily  Hypokalemia Potassium 3.2 Repleted with KCl p.o. 40 mEq x 2 doses Magnesium 2.2 on 12/06/2018.  Hypophosphatemia Phosphorus 1.9 Repleted with sodium phosphate Encourage increase oral intake  Elevated LFTs Abdominal ultrasound unrevealing, no gallstones or wall thickening CRP and sed rate negative Obtain acute hepatitis panel  History of asthma Stable Continue albuterol as needed  Anxiety Continue Lexapro   DVT prophylaxis:Subcutaneous heparin Code Status:Full code Family Communication:No family available. Disposition Plan:Anticipate discharge when methadone clinic follow-up appointment has been established or when neurosurgery and neurology sign off. Consults called:Neurosurgery, neurology   Discharge Exam: BP 122/77 (BP Location: Right Arm)    Pulse 70    Temp 98.5 F (36.9 C) (Oral)    Resp 20    Ht 6' (1.829 m)    Wt 66.7 kg    SpO2 93%    BMI 19.94 kg/m   General: 38 y.o. year-old male well developed well nourished in no acute distress.  Alert and oriented x3.  Cardiovascular: Regular rate and rhythm with no rubs or gallops.  No thyromegaly or JVD noted.    Respiratory: Clear to auscultation with no wheezes or rales. Good inspiratory effort.  Abdomen: Soft nontender nondistended with normal bowel sounds x4 quadrants.  Musculoskeletal: No lower extremity edema. 2/4 pulses in all 4 extremities. B/L foot drop.  Psychiatry: Mood is appropriate for condition and setting  Discharge Instructions You were cared for by a hospitalist during your hospital stay. If you have any questions about your discharge medications or the care you received while you were  in the hospital after you are discharged, you can call the unit and asked to speak with the hospitalist on call if the hospitalist that took care of you is not available. Once you are discharged, your primary care physician will handle any further medical issues. Please note that NO REFILLS for any discharge medications will be authorized once you are discharged, as it is imperative that you return to your primary care physician (or establish a relationship with a primary care physician if you do not have one) for your aftercare needs so that they can reassess your need for medications and monitor your lab values.   Allergies as of 12/09/2018      Reactions   Penicillin G Anaphylaxis   Airway swelling Per pt: he thinks he is allergic      Medication List    ASK your doctor about these medications   acetaminophen 500 MG tablet Commonly known as: TYLENOL Take 1,000 mg by mouth every 6 (six) hours as needed for moderate pain.   albuterol (2.5 MG/3ML) 0.083% nebulizer solution Commonly known as: PROVENTIL Take 3 mLs (2.5 mg total) by nebulization every 6 (six) hours as needed for wheezing or shortness of breath.   albuterol 108 (90 Base) MCG/ACT inhaler Commonly known as: VENTOLIN HFA Inhale 2 puffs into the lungs every 6 (six) hours as needed for wheezing or shortness of breath.   amoxicillin 500 MG capsule Commonly known as: AMOXIL Take 1 capsule (500 mg total) by  mouth 3 (three) times daily.   erythromycin with ethanol 2 % gel Commonly known as: EMGEL Apply topically daily.   escitalopram 20 MG tablet Commonly known as: LEXAPRO Take 20 mg by mouth daily.   ibuprofen 600 MG tablet Commonly known as: ADVIL Take 1 tablet (600 mg total) by mouth 3 (three) times daily after meals.   methocarbamol 500 MG tablet Commonly known as: ROBAXIN Take 2 tablets (1,000 mg total) by mouth every 8 (eight) hours as needed for muscle spasms.   naloxone 4 MG/0.1ML Liqd nasal spray kit Commonly  known as: NARCAN Use in the event of suspected overdose   predniSONE 20 MG tablet Commonly known as: Deltasone Take 2 tablets (40 mg total) by mouth daily.      Allergies  Allergen Reactions   Penicillin G Anaphylaxis    Airway swelling Per pt: he thinks he is allergic   Follow-up Information    [facility] Go to.   Specialty: Behavioral Health Why: To obtain methadone Contact information: [address] [place] [address] [phone]        [facility], [facility] Follow up.   Specialty: Behavioral Health Why: To obtain Methadone  Contact information: [address] [place] [address] [phone]            The results of significant diagnostics from this hospitalization (including imaging, microbiology, ancillary and laboratory) are listed below for reference.    Significant Diagnostic Studies: Dg Chest 2 View  Result Date: 12/05/2018 CLINICAL DATA:  Shortness of breath and wheezing. EXAM: CHEST - 2 VIEW COMPARISON:  Chest x-ray dated May 30, 2017. FINDINGS: The heart size and mediastinal contours are within normal limits. Normal pulmonary vascularity. The lungs are hyperinflated. No focal consolidation, pleural effusion, or pneumothorax. No acute osseous abnormality. IMPRESSION: 1.  No active cardiopulmonary disease. 2. COPD. Electronically Signed   By: Titus Dubin M.D.   On: 12/05/2018 11:32   Mr Cervical Spine Wo Contrast  Result Date: 12/05/2018 CLINICAL DATA:  Bilateral lower extremity weakness and numbness for 2 days. EXAM: MRI CERVICAL AND THORACIC SPINE WITHOUT CONTRAST TECHNIQUE: Multiplanar and multiecho pulse sequences of the cervical spine, to include the craniocervical junction and cervicothoracic junction, and the thoracic spine, were obtained without intravenous contrast. COMPARISON:  Chest CTA 01/20/2017.  Bifemoral runoff CT 12/05/2018. FINDINGS: Despite efforts by the technologist  and patient, mild motion artifact is present on today's exam and could not be eliminated. This reduces exam sensitivity and specificity. Patient terminated the examination prior to its completion. No axial imaging through the thoracic spine was obtained. No post-contrast imaging or lumbar spine imaging was obtained. MRI CERVICAL SPINE FINDINGS Alignment: Normal. Vertebrae: No acute or suspicious osseous findings. Cord: Normal in signal and caliber. Posterior Fossa, vertebral arteries, paraspinal tissues: Visualized portions of the posterior fossa and paraspinal soft tissues appear unremarkable. Bilateral vertebral artery flow voids. Disc levels: Disc space evaluation is limited by motion on the axial images. There is no evidence of discitis or osteomyelitis. The disc heights are well-maintained. No disc herniation, spinal stenosis or nerve root encroachment. MRI THORACIC SPINE FINDINGS Alignment:  Normal. Vertebrae: No evidence of acute fracture or focal marrow lesion. There is no evidence of discitis or osteomyelitis. Cord: The thoracic cord is normal in signal and caliber. Conus medullaris extends to the upper L1 level. Paraspinal and other soft tissues: No significant paraspinal findings on sagittal imaging. Disc levels: Thoracic disc valuation limited by the  lack of axial images. The sagittal images demonstrate a small central disc protrusion at T2-3 without cord deformity or foraminal compromise. At T8-9, there is a right paracentral disc extrusion with cephalad extension of disc material behind the T8 vertebral body. This could be symptomatic, although does not deform the cord or significantly narrow the right foramen. There is a small central disc protrusion at T11 and a shallow right paracentral disc protrusion at T12-L1. No significant foraminal narrowing or nerve root encroachment identified. IMPRESSION: 1. Examination is limited. There is motion on the axial images through the cervical spine. Patient was  unable to complete the axial imaging through the thoracic spine, in the lumbar spine was not imaged. 2. No significant findings in the cervical spine. 3. Several thoracic disc herniations are present, largest at T8-9 where there is right paracentral disc extrusion with cephalad extension of disc material. No cord deformity, foraminal compromise or nerve root encroachment identified. 4. No evidence of discitis or osteomyelitis. Electronically Signed   By: Richardean Sale M.D.   On: 12/05/2018 15:09   Mr Thoracic Spine Wo Contrast  Result Date: 12/05/2018 CLINICAL DATA:  Bilateral lower extremity weakness and numbness for 2 days. EXAM: MRI CERVICAL AND THORACIC SPINE WITHOUT CONTRAST TECHNIQUE: Multiplanar and multiecho pulse sequences of the cervical spine, to include the craniocervical junction and cervicothoracic junction, and the thoracic spine, were obtained without intravenous contrast. COMPARISON:  Chest CTA 01/20/2017.  Bifemoral runoff CT 12/05/2018. FINDINGS: Despite efforts by the technologist and patient, mild motion artifact is present on today's exam and could not be eliminated. This reduces exam sensitivity and specificity. Patient terminated the examination prior to its completion. No axial imaging through the thoracic spine was obtained. No post-contrast imaging or lumbar spine imaging was obtained. MRI CERVICAL SPINE FINDINGS Alignment: Normal. Vertebrae: No acute or suspicious osseous findings. Cord: Normal in signal and caliber. Posterior Fossa, vertebral arteries, paraspinal tissues: Visualized portions of the posterior fossa and paraspinal soft tissues appear unremarkable. Bilateral vertebral artery flow voids. Disc levels: Disc space evaluation is limited by motion on the axial images. There is no evidence of discitis or osteomyelitis. The disc heights are well-maintained. No disc herniation, spinal stenosis or nerve root encroachment. MRI THORACIC SPINE FINDINGS Alignment:  Normal.  Vertebrae: No evidence of acute fracture or focal marrow lesion. There is no evidence of discitis or osteomyelitis. Cord: The thoracic cord is normal in signal and caliber. Conus medullaris extends to the upper L1 level. Paraspinal and other soft tissues: No significant paraspinal findings on sagittal imaging. Disc levels: Thoracic disc valuation limited by the lack of axial images. The sagittal images demonstrate a small central disc protrusion at T2-3 without cord deformity or foraminal compromise. At T8-9, there is a right paracentral disc extrusion with cephalad extension of disc material behind the T8 vertebral body. This could be symptomatic, although does not deform the cord or significantly narrow the right foramen. There is a small central disc protrusion at T11 and a shallow right paracentral disc protrusion at T12-L1. No significant foraminal narrowing or nerve root encroachment identified. IMPRESSION: 1. Examination is limited. There is motion on the axial images through the cervical spine. Patient was unable to complete the axial imaging through the thoracic spine, in the lumbar spine was not imaged. 2. No significant findings in the cervical spine. 3. Several thoracic disc herniations are present, largest at T8-9 where there is right paracentral disc extrusion with cephalad extension of disc material. No cord deformity, foraminal compromise or  nerve root encroachment identified. 4. No evidence of discitis or osteomyelitis. Electronically Signed   By: Richardean Sale M.D.   On: 12/05/2018 15:09   Mr Lumbar Spine W Wo Contrast  Result Date: 12/08/2018 CLINICAL DATA:  Weakness of the lower extremities EXAM: MRI LUMBAR SPINE WITHOUT AND WITH CONTRAST TECHNIQUE: Multiplanar and multiecho pulse sequences of the lumbar spine were obtained without and with intravenous contrast. CONTRAST:  7 mL Gadavist IV contrast COMPARISON:  None. FINDINGS: Segmentation:  Standard. Alignment:  Physiologic. Vertebrae: No  fracture, evidence of discitis, or bone lesion. Discogenic endplate marrow changes at the L4-5 and L5-S1 levels. Endplates are preserved. No fluid signal within the disc or surrounding inflammatory changes to suggest discitis. Conus medullaris and cauda equina: Conus extends to the L1 level. Conus and cauda equina appear normal. Paraspinal and other soft tissues: There is intramuscular edema and enhancement within the bilateral paraspinal musculature of the low back extending from approximately the L2-3 through L5-S1 levels. Disc levels: L1-L2: No significant disc protrusion, foraminal stenosis, or canal stenosis. L2-L3: No significant disc protrusion, foraminal stenosis, or canal stenosis. L3-L4: No significant disc protrusion, foraminal stenosis, or canal stenosis. L4-L5: Mild diffuse disc bulge and mild bilateral facet arthrosis. No foraminal or canal stenosis. L5-S1: Right paracentral disc protrusion and mild bilateral facet arthrosis result in mild left foraminal stenosis. No canal stenosis. There is borderline narrowing of the right subarticular recess without definite impingement of the descending right S1 nerve root. IMPRESSION: 1. Intramuscular edema and enhancement of the posterior paraspinal musculature extending from approximately L2-3 through L5-S1 suggesting a non-specific myositis. No intramuscular fluid collections to suggest abscess or myonecrosis. 2. Mild degenerative disc disease of L4-5 and L5-S1 resulting in mild left foraminal stenosis and borderline right subarticular recess stenosis at the L5-S1 level. 3. No evidence of discitis-osteomyelitis. Electronically Signed   By: Davina Poke M.D.   On: 12/08/2018 16:23   Mr Pelvis W Wo Contrast  Result Date: 12/08/2018 CLINICAL DATA:  Bilateral lower extremity weakness EXAM: MRI PELVIS WITHOUT AND WITH CONTRAST TECHNIQUE: Multiplanar multisequence MR imaging of the pelvis was performed both before and after administration of intravenous  contrast. CONTRAST:  7 mL Gadavist COMPARISON:  CT 04/01/2007 FINDINGS: Urinary Tract: Unremarkable urinary bladder without wall thickening. No dilation of the distal ureters. Bowel:  Unremarkable visualized pelvic bowel loops. Vascular/Lymphatic: Mildly prominent bilateral inguinal lymph nodes. No pathologically enlarged lymph nodes within the pelvis. No significant vascular abnormality seen. Reproductive:  Unremarkable prostate. Other:  None. Musculoskeletal: Focal area of edema and enhancement within the right gluteus medius muscle (series 21, image 24). No hip joint effusion. Small focus of subcortical marrow edema within the lateral aspect of the left femoral neck, likely reactive/degenerative. Partially visualized intramuscular edema within the bilateral paraspinal musculature of the lower lumbar region, as seen on concurrent lumbar spine MRI. IMPRESSION: 1. Intramuscular edema and enhancement of the lower lumbar posterior paraspinal musculature and an additional focal area within the right gluteus medius. Findings suggest a nonspecific myositis. 2. No acute findings within the pelvis. 3. No bony abnormality or hip joint effusion. Electronically Signed   By: Davina Poke M.D.   On: 12/08/2018 16:44   Ct Angio Aortobifemoral W And/or Wo Contrast  Result Date: 12/05/2018 CLINICAL DATA:  38 year old male with bilateral lower extremity weakness and decreased pulses worse on the right than the left EXAM: CT ANGIOGRAPHY OF ABDOMINAL AORTA WITH ILIOFEMORAL RUNOFF TECHNIQUE: Multidetector CT imaging of the abdomen, pelvis and lower extremities was performed  using the standard protocol during bolus administration of intravenous contrast. Multiplanar CT image reconstructions and MIPs were obtained to evaluate the vascular anatomy. CONTRAST:  13m OMNIPAQUE IOHEXOL 350 MG/ML SOLN COMPARISON:  Prior CT abdomen and pelvis 05/19/2005 FINDINGS: VASCULAR Aorta: Normal caliber aorta without aneurysm, dissection,  vasculitis or significant stenosis. Celiac: Patent without evidence of aneurysm, dissection, vasculitis or significant stenosis. SMA: Patent without evidence of aneurysm, dissection, vasculitis or significant stenosis. Renals: Both renal arteries are patent without evidence of aneurysm, dissection, vasculitis, fibromuscular dysplasia or significant stenosis. IMA: Patent without evidence of aneurysm, dissection, vasculitis or significant stenosis. RIGHT Lower Extremity Inflow: Common, internal and external iliac arteries are patent without evidence of aneurysm, dissection, vasculitis or significant stenosis. Outflow: Common, superficial and profunda femoral arteries and the popliteal artery are patent without evidence of aneurysm, dissection, vasculitis or significant stenosis. Runoff: Patent three vessel runoff to the ankle. LEFT Lower Extremity Inflow: Common, internal and external iliac arteries are patent without evidence of aneurysm, dissection, vasculitis or significant stenosis. Outflow: Common, superficial and profunda femoral arteries and the popliteal artery are patent without evidence of aneurysm, dissection, vasculitis or significant stenosis. Runoff: Patent three vessel runoff to the ankle. Veins: No obvious venous abnormality within the limitations of this arterial phase study. Review of the MIP images confirms the above findings. NON-VASCULAR Lower chest: The lung bases are clear. Visualized cardiac structures are within normal limits for size. No pericardial effusion. Unremarkable visualized distal thoracic esophagus. Hepatobiliary: No focal liver abnormality is seen. No gallstones, gallbladder wall thickening, or biliary dilatation. Pancreas: Unremarkable. No pancreatic ductal dilatation or surrounding inflammatory changes. Spleen: Normal in size without focal abnormality. Adrenals/Urinary Tract: Adrenal glands are unremarkable. Kidneys are normal, without renal calculi, focal lesion, or  hydronephrosis. Bladder is unremarkable. Stomach/Bowel: Stomach is within normal limits. Appendix appears normal. No evidence of bowel wall thickening, distention, or inflammatory changes. Lymphatic: No suspicious lymphadenopathy. Reproductive: Prostate is unremarkable. Other: No evidence of abdominal wall hernia or ascites. The bladder is full. Musculoskeletal: Lower lumbar degenerative disc disease with broad-based disc bulges at L4-L5 and L5-S1. IMPRESSION: VASCULAR 1. Normal CTA abdomen and pelvis with bilateral lower extremity runoff. NON-VASCULAR 1. Lower lumbar degenerative disc disease at L4-L5 and L5-S1. Electronically Signed   By: HJacqulynn CadetM.D.   On: 12/05/2018 11:00   UKoreaAbdomen Limited Ruq  Result Date: 12/06/2018 CLINICAL DATA:  Elevated liver function tests. EXAM: ULTRASOUND ABDOMEN LIMITED RIGHT UPPER QUADRANT COMPARISON:  Abdominal ultrasound 05/08/2007. CT angiogram abdomen and pelvis 12/05/2018. FINDINGS: Gallbladder: No gallstones or wall thickening visualized. No sonographic Murphy sign noted by sonographer. Common bile duct: Diameter: 0.3 cm Liver: No focal lesion identified. Within normal limits in parenchymal echogenicity. Portal vein is patent on color Doppler imaging with normal direction of blood flow towards the liver. Other: None. IMPRESSION: Negative exam. Electronically Signed   By: TInge RiseM.D.   On: 12/06/2018 09:40    Microbiology: Recent Results (from the past 240 hour(s))  SARS Coronavirus 2 ([facility]) Nasopharyngeal Nasopharyngeal Swab     Status: None   Collection Time: 12/05/18  8:43 AM   Specimen: Nasopharyngeal Swab  Result Value Ref Range Status   SARS Coronavirus 2 NEGATIVE NEGATIVE Final    Comment: (NOTE) If result is NEGATIVE SARS-CoV-2 target nucleic acids are NOT DETECTED. The SARS-CoV-2 RNA is generally detectable in upper and lower  respiratory specimens during the acute phase of  infection. The lowest  concentration of SARS-CoV-2  viral copies this assay can detect is 250  copies / mL. A negative result does not preclude SARS-CoV-2 infection  and should not be used as the sole basis for treatment or other  patient management decisions.  A negative result may occur with  improper specimen collection / handling, submission of specimen other  than nasopharyngeal swab, presence of viral mutation(s) within the  areas targeted by this assay, and inadequate number of viral copies  (<250 copies / mL). A negative result must be combined with clinical  observations, patient history, and epidemiological information. If result is POSITIVE SARS-CoV-2 target nucleic acids are DETECTED. The SARS-CoV-2 RNA is generally detectable in upper and lower  respiratory specimens dur ing the acute phase of infection.  Positive  results are indicative of active infection with SARS-CoV-2.  Clinical  correlation with patient history and other diagnostic information is  necessary to determine patient infection status.  Positive results do  not rule out bacterial infection or co-infection with other viruses. If result is PRESUMPTIVE POSTIVE SARS-CoV-2 nucleic acids MAY BE PRESENT.   A presumptive positive result was obtained on the submitted specimen  and confirmed on repeat testing.  While 2019 novel coronavirus  (SARS-CoV-2) nucleic acids may be present in the submitted sample  additional confirmatory testing may be necessary for epidemiological  and / or clinical management purposes  to differentiate between  SARS-CoV-2 and other Sarbecovirus currently known to infect humans.  If clinically indicated additional testing with an alternate test  methodology [phone]) is advised. The SARS-CoV-2 RNA is generally  detectable in upper and lower respiratory sp ecimens during the acute  phase of infection. The expected result is Negative. Fact Sheet for Patients:   StrictlyIdeas.no Fact Sheet for Healthcare Providers: BankingDealers.co.za This test is not yet approved or cleared by the [place] FDA and has been authorized for detection and/or diagnosis of SARS-CoV-2 by FDA under an Emergency Use Authorization (EUA).  This EUA will remain in effect (meaning this test can be used) for the duration of the COVID-19 declaration under Section 564(b)(1) of the Act, 21 U.S.C. section 360bbb-3(b)(1), unless the authorization is terminated or revoked sooner. Performed at [facility], [place] [address], [place], [place] [address]   Culture, blood (Routine X 2) w Reflex to ID Panel     Status: None (Preliminary result)   Collection Time: 12/06/18  2:26 AM   Specimen: BLOOD  Result Value Ref Range Status   Specimen Description BLOOD LEFT ANTECUBITAL  Final   Special Requests   Final    BOTTLES DRAWN AEROBIC AND ANAEROBIC Blood Culture adequate volume   Culture   Final    NO GROWTH 4 DAYS Performed at [facility], [place] [address], [place], [place] [address]    Report Status PENDING  Incomplete  Culture, blood (Routine X 2) w Reflex to ID Panel     Status: None (Preliminary result)   Collection Time: 12/06/18  2:28 AM   Specimen: BLOOD  Result Value Ref Range Status   Specimen Description BLOOD LEFT ANTECUBITAL  Final   Special Requests   Final    BOTTLES DRAWN AEROBIC AND ANAEROBIC Blood Culture adequate volume   Culture   Final    NO GROWTH 4 DAYS Performed at [facility], [place] [address] [place],  [address]    Report Status PENDING  Incomplete     Labs: Basic Metabolic Panel: Recent Labs  Lab 12/04/18 2305 12/05/18 0841 12/05/18 0940  12/06/18 0216 12/09/18 0613  NA 137 136 139  --  140  K 4.4 3.9 4.8  --  3.2*  CL 96* 98  --   --  104  CO2  --  27  --   --  30  GLUCOSE 150* 132*  --   --  143*  BUN 20 14  --   --  17  CREATININE 0.90 0.74  --   --  0.92    CALCIUM  --  8.9  --   --  8.5*  MG  --   --   --  2.2  --   PHOS  --   --   --  1.9*  --    Liver Function Tests: Recent Labs  Lab 12/05/18 0841  AST 99*  ALT 53*  ALKPHOS 71  BILITOT 0.6  PROT 6.7  ALBUMIN 3.8   No results for input(s): LIPASE, AMYLASE in the last 168 hours. No results for input(s): AMMONIA in the last 168 hours. CBC: Recent Labs  Lab 12/04/18 2241 12/04/18 2305 12/05/18 0940 12/09/18 0613  WBC 8.8  --   --  10.9*  HGB 15.2 16.0 13.6 14.7  HCT 46.5 47.0 40.0 45.0  MCV 91.4  --   --  90.4  PLT 303  --   --  256   Cardiac Enzymes: No results for input(s): CKTOTAL, CKMB, CKMBINDEX, TROPONINI in the last 168 hours. BNP: BNP (last 3 results) No results for input(s): BNP in the last 8760 hours.  ProBNP (last 3 results) No results for input(s): PROBNP in the last 8760 hours.  CBG: No results for input(s): GLUCAP in the last 168 hours.     Signed:  Kayleen Memos, MD [facility] 12/10/2018, 4:55 PM

## 2018-12-11 NOTE — Plan of Care (Signed)
I was notified by microbiology lab that patient's blood culture 1/2 growing gram-positive rods.  Final report, sensitivities are still pending, could be contaminant.  Reviewed discharge summary, patient left AMA yesterday on 9/9.  Requested microbiology lab to notify me again tomorrow once microbe is identified and sensitivities, ruling out contaminant. Will need to notify patient if he has bacteremia.   Estill Cotta M.D. [facility] 12/11/2018, 9:48 AM  Pager: [phone]

## 2018-12-12 LAB — CULTURE, BLOOD (ROUTINE X 2): Special Requests: ADEQUATE

## 2018-12-15 LAB — CULTURE, BLOOD (ROUTINE X 2)
Culture: NO GROWTH
Special Requests: ADEQUATE

## 2018-12-30 ENCOUNTER — Other Ambulatory Visit: Payer: Self-pay

## 2018-12-30 ENCOUNTER — Ambulatory Visit (INDEPENDENT_AMBULATORY_CARE_PROVIDER_SITE_OTHER): Payer: Self-pay | Admitting: Licensed Clinical Social Worker

## 2018-12-30 ENCOUNTER — Ambulatory Visit (INDEPENDENT_AMBULATORY_CARE_PROVIDER_SITE_OTHER): Payer: Self-pay | Admitting: Primary Care

## 2018-12-30 DIAGNOSIS — R29898 Other symptoms and signs involving the musculoskeletal system: Secondary | ICD-10-CM

## 2018-12-30 DIAGNOSIS — R200 Anesthesia of skin: Secondary | ICD-10-CM

## 2018-12-30 DIAGNOSIS — Z2821 Immunization not carried out because of patient refusal: Secondary | ICD-10-CM

## 2018-12-30 DIAGNOSIS — Z760 Encounter for issue of repeat prescription: Secondary | ICD-10-CM

## 2018-12-30 DIAGNOSIS — F1110 Opioid abuse, uncomplicated: Secondary | ICD-10-CM

## 2018-12-30 DIAGNOSIS — Z09 Encounter for follow-up examination after completed treatment for conditions other than malignant neoplasm: Secondary | ICD-10-CM

## 2018-12-30 DIAGNOSIS — J4550 Severe persistent asthma, uncomplicated: Secondary | ICD-10-CM

## 2018-12-30 DIAGNOSIS — Z7689 Persons encountering health services in other specified circumstances: Secondary | ICD-10-CM

## 2018-12-30 DIAGNOSIS — R531 Weakness: Secondary | ICD-10-CM

## 2018-12-30 MED ORDER — ALBUTEROL SULFATE (2.5 MG/3ML) 0.083% IN NEBU: 2.5000 mg | INHALATION_SOLUTION | Freq: Four times a day (QID) | RESPIRATORY_TRACT | 1 refills | Status: DC | PRN

## 2018-12-30 MED ORDER — ATROVENT HFA 17 MCG/ACT IN AERS: 2.0000 | INHALATION_SPRAY | Freq: Four times a day (QID) | RESPIRATORY_TRACT | 12 refills | Status: DC | PRN

## 2018-12-30 MED ORDER — ALBUTEROL SULFATE HFA 108 (90 BASE) MCG/ACT IN AERS: 2.0000 | INHALATION_SPRAY | Freq: Four times a day (QID) | RESPIRATORY_TRACT | 1 refills | Status: DC | PRN

## 2018-12-30 MED ORDER — ESCITALOPRAM OXALATE 20 MG PO TABS: 20.0000 mg | ORAL_TABLET | Freq: Every day | ORAL | 3 refills | Status: DC

## 2018-12-30 NOTE — Patient Instructions (Signed)
Asthma, Adult ° °Asthma is a long-term (chronic) condition in which the airways get tight and narrow. The airways are the breathing passages that lead from the nose and mouth down into the lungs. A person with asthma will have times when symptoms get worse. These are called asthma attacks. They can cause coughing, whistling sounds when you breathe (wheezing), shortness of breath, and chest pain. They can make it hard to breathe. There is no cure for asthma, but medicines and lifestyle changes can help control it. °There are many things that can bring on an asthma attack or make asthma symptoms worse (triggers). Common triggers include: °· Mold. °· Dust. °· Cigarette smoke. °· Cockroaches. °· Things that can cause allergy symptoms (allergens). These include animal skin flakes (dander) and pollen from trees or grass. °· Things that pollute the air. These may include household cleaners, wood smoke, smog, or chemical odors. °· Cold air, weather changes, and wind. °· Crying or laughing hard. °· Stress. °· Certain medicines or drugs. °· Certain foods such as dried fruit, potato chips, and grape juice. °· Infections, such as a cold or the flu. °· Certain medical conditions or diseases. °· Exercise or tiring activities. °Asthma may be treated with medicines and by staying away from the things that cause asthma attacks. Types of medicines may include: °· Controller medicines. These help prevent asthma symptoms. They are usually taken every day. °· Fast-acting reliever or rescue medicines. These quickly relieve asthma symptoms. They are used as needed and provide short-term relief. °· Allergy medicines if your attacks are brought on by allergens. °· Medicines to help control the body's defense (immune) system. °Follow these instructions at home: °Avoiding triggers in your home °· Change your heating and air conditioning filter often. °· Limit your use of fireplaces and wood stoves. °· Get rid of pests (such as roaches and  mice) and their droppings. °· Throw away plants if you see mold on them. °· Clean your floors. Dust regularly. Use cleaning products that do not smell. °· Have someone vacuum when you are not home. Use a vacuum cleaner with a HEPA filter if possible. °· Replace carpet with wood, tile, or vinyl flooring. Carpet can trap animal skin flakes and dust. °· Use allergy-proof pillows, mattress covers, and box spring covers. °· Wash bed sheets and blankets every week in hot water. Dry them in a dryer. °· Keep your bedroom free of any triggers. °· Avoid pets and keep windows closed when things that cause allergy symptoms are in the air. °· Use blankets that are made of polyester or cotton. °· Clean bathrooms and kitchens with bleach. If possible, have someone repaint the walls in these rooms with mold-resistant paint. Keep out of the rooms that are being cleaned and painted. °· Wash your hands often with soap and water. If soap and water are not available, use hand sanitizer. °· Do not allow anyone to smoke in your home. °General instructions °· Take over-the-counter and prescription medicines only as told by your doctor. °? Talk with your doctor if you have questions about how or when to take your medicines. °? Make note if you need to use your medicines more often than usual. °· Do not use any products that contain nicotine or tobacco, such as cigarettes and e-cigarettes. If you need help quitting, ask your doctor. °· Stay away from secondhand smoke. °· Avoid doing things outdoors when allergen counts are high and when air quality is low. °· Wear a ski mask   when doing outdoor activities in the winter. The mask should cover your nose and mouth. Exercise indoors on cold days if you can. °· Warm up before you exercise. Take time to cool down after exercise. °· Use a peak flow meter as told by your doctor. A peak flow meter is a tool that measures how well the lungs are working. °· Keep track of the peak flow meter's readings.  Write them down. °· Follow your asthma action plan. This is a written plan for taking care of your asthma and treating your attacks. °· Make sure you get all the shots (vaccines) that your doctor recommends. Ask your doctor about a flu shot and a pneumonia shot. °· Keep all follow-up visits as told by your doctor. This is important. °Contact a doctor if: °· You have wheezing, shortness of breath, or a cough even while taking medicine to prevent attacks. °· The mucus you cough up (sputum) is thicker than usual. °· The mucus you cough up changes from clear or white to yellow, green, gray, or bloody. °· You have problems from the medicine you are taking, such as: °? A rash. °? Itching. °? Swelling. °? Trouble breathing. °· You need reliever medicines more than 2-3 times a week. °· Your peak flow reading is still at 50-79% of your personal best after following the action plan for 1 hour. °· You have a fever. °Get help right away if: °· You seem to be worse and are not responding to medicine during an asthma attack. °· You are short of breath even at rest. °· You get short of breath when doing very little activity. °· You have trouble eating, drinking, or talking. °· You have chest pain or tightness. °· You have a fast heartbeat. °· Your lips or fingernails start to turn blue. °· You are light-headed or dizzy, or you faint. °· Your peak flow is less than 50% of your personal best. °· You feel too tired to breathe normally. °Summary °· Asthma is a long-term (chronic) condition in which the airways get tight and narrow. An asthma attack can make it hard to breathe. °· Asthma cannot be cured, but medicines and lifestyle changes can help control it. °· Make sure you understand how to avoid triggers and how and when to use your medicines. °This information is not intended to replace advice given to you by your health care provider. Make sure you discuss any questions you have with your health care provider. °Document  Released: 09/05/2007 Document Revised: 05/22/2018 Document Reviewed: 04/23/2016 °Elsevier Patient Education © 2020 Else[employer] Inc. ° °

## 2018-12-30 NOTE — Progress Notes (Signed)
Established Patient Office Visit  Subjective:  Patient ID: Craig Woodward, male    DOB: 1980-10-12  Age: 38 y.o. MRN: [id number]  CC: No chief complaint on file.   HPI BUNNIE LEDERMAN presents for hospital follow up on 12/04/2018 left AMA. He presents today with concerns with bilateral lower extremity numbness and weakness. Reviewed hospital AMA and this needed to be followed up with a neurologist.   Past Medical History:  Diagnosis Date  . ADHD (attention deficit hyperactivity disorder) 12/31/2011  . Allergy   . Asthma    childhood  . Back pain     Past Surgical History:  Procedure Laterality Date  . tubes in ears    . TYMPANOSTOMY TUBE PLACEMENT      Family History  Problem Relation Age of Onset  . Heart disease Mother   . Stroke Mother   . Hypertension Mother   . Diabetes Mother   . Hypertension Father   . Diabetes Father   . Hypertension Maternal Aunt   . Heart disease Maternal Grandmother   . Cancer Maternal Grandfather        lung CA    Social History   Socioeconomic History  . Marital status: Divorced    Spouse name: Not on file  . Number of children: Not on file  . Years of education: Not on file  . Highest education level: Not on file  Occupational History  . Not on file  Social Needs  . Financial resource strain: Not on file  . Food insecurity    Worry: Not on file    Inability: Not on file  . Transportation needs    Medical: Not on file    Non-medical: Not on file  Tobacco Use  . Smoking status: Current Every Day Smoker    Packs/day: 1.00  . Smokeless tobacco: Never Used  Substance and Sexual Activity  . Alcohol use: Yes    Comment: 12 to 18 beers  . Drug use: Yes    Types: Cocaine    Comment: herione and cocaine a few days ago   . Sexual activity: Not on file  Lifestyle  . Physical activity    Days per week: Not on file    Minutes per session: Not on file  . Stress: Not on file  Relationships  . Social Herbalist on  phone: Not on file    Gets together: Not on file    Attends religious service: Not on file    Active member of club or organization: Not on file    Attends meetings of clubs or organizations: Not on file    Relationship status: Not on file  . Intimate partner violence    Fear of current or ex partner: Not on file    Emotionally abused: Not on file    Physically abused: Not on file    Forced sexual activity: Not on file  Other Topics Concern  . Not on file  Social History Narrative   Reports history of excessive Xanax use in past.    Outpatient Medications Prior to Visit  Medication Sig Dispense Refill  . acetaminophen (TYLENOL) 500 MG tablet Take 1,000 mg by mouth every 6 (six) hours as needed for moderate pain.    Marland Kitchen amoxicillin (AMOXIL) 500 MG capsule Take 1 capsule (500 mg total) by mouth 3 (three) times daily. (Patient not taking: Reported on 12/05/2018) 21 capsule 0  . erythromycin with ethanol (EMGEL) 2 % gel  Apply topically daily. (Patient not taking: Reported on 12/05/2018) 30 g 0  . methocarbamol (ROBAXIN) 500 MG tablet Take 2 tablets (1,000 mg total) by mouth every 8 (eight) hours as needed for muscle spasms. (Patient not taking: Reported on 12/05/2018) 30 tablet 0  . naloxone (NARCAN) nasal spray 4 mg/0.1 mL Use in the event of suspected overdose 1 kit 0  . predniSONE (DELTASONE) 20 MG tablet Take 2 tablets (40 mg total) by mouth daily. (Patient not taking: Reported on 12/05/2018) 10 tablet 0  . albuterol (PROVENTIL HFA;VENTOLIN HFA) 108 (90 Base) MCG/ACT inhaler Inhale 2 puffs into the lungs every 6 (six) hours as needed for wheezing or shortness of breath. 1 Inhaler 0  . albuterol (PROVENTIL) (2.5 MG/3ML) 0.083% nebulizer solution Take 3 mLs (2.5 mg total) by nebulization every 6 (six) hours as needed for wheezing or shortness of breath. 75 mL 1  . escitalopram (LEXAPRO) 20 MG tablet Take 20 mg by mouth daily.    Marland Kitchen ibuprofen (ADVIL,MOTRIN) 600 MG tablet Take 1 tablet (600 mg total) by  mouth 3 (three) times daily after meals. (Patient not taking: Reported on 12/05/2018) 30 tablet 0   No facility-administered medications prior to visit.     Allergies  Allergen Reactions  . Penicillin G Anaphylaxis    Airway swelling Per pt: he thinks he is allergic    ROS Review of Systems  HENT: Positive for congestion.   Respiratory: Positive for shortness of breath and wheezing.   Psychiatric/Behavioral: Positive for agitation.      Objective:    Physical Exam  Constitutional: He is oriented to person, place, and time. He appears well-developed.  Thin built   Eyes: Pupils are equal, round, and reactive to light. EOM are normal.  Neck: Normal range of motion.  Cardiovascular: Normal rate and regular rhythm.  Pulmonary/Chest: He has wheezes.  Abdominal: Soft. Bowel sounds are normal.  Musculoskeletal: Normal range of motion.  Neurological: He is oriented to person, place, and time.  Skin: Skin is warm and dry.  Psychiatric: He has a normal mood and affect.    There were no vitals taken for this visit. Wt Readings from Last 3 Encounters:  12/07/18 147 lb 0.8 oz (66.7 kg)  07/12/17 180 lb (81.6 kg)  05/30/17 200 lb (90.7 kg)     Health Maintenance Due  Topic Date Due  . TETANUS/TDAP  09/18/2001  . INFLUENZA VACCINE  11/01/2018    There are no preventive care reminders to display for this patient.  No results found for: TSH Lab Results  Component Value Date   WBC 10.9 (H) 12/09/2018   HGB 14.7 12/09/2018   HCT 45.0 12/09/2018   MCV 90.4 12/09/2018   PLT 256 12/09/2018   Lab Results  Component Value Date   NA 140 12/09/2018   K 3.2 (L) 12/09/2018   CO2 30 12/09/2018   GLUCOSE 143 (H) 12/09/2018   BUN 17 12/09/2018   CREATININE 0.92 12/09/2018   BILITOT 0.6 12/05/2018   ALKPHOS 71 12/05/2018   AST 99 (H) 12/05/2018   ALT 53 (H) 12/05/2018   PROT 6.7 12/05/2018   ALBUMIN 3.8 12/05/2018   CALCIUM 8.5 (L) 12/09/2018   ANIONGAP 6 12/09/2018   No  results found for: CHOL No results found for: HDL No results found for: LDLCALC No results found for: TRIG No results found for: CHOLHDL No results found for: HGBA1C    Assessment & Plan:  Diagnoses and all orders for this visit:  Encounter to establish care Juluis Mire, NP-C will be your  (PCP) that will  provides both the first contact for a person with an undiagnosed health concern as well as continuing care of varied medical conditions, not limited by cause, organ system, or diagnosis.   Heroin abuse ([place]) Spoke with clinical social worker he is not interested in stopping at this time  Severe persistent asthma without complication Asthma is recognize by classic presentation of symptoms which include cough, wheeze, and shortness of breath brought on by characteristic triggers and relieved by bronchodilating medications.  This is caused by from hyper responsiveness with tendency of airways to narrow excessively in response to a stimuli.    Hospital discharge follow-up Patient left AMA  Influenza vaccination declined  Bilateral leg weakness See hospital notes refer to neurology   Tetanus, diphtheria, and acellular pertussis (Tdap) vaccination declined Tdap is recommended every 10 years for adults weekly or primary she gets tetanus..  At least 1 of those doses should be with Tdap in adults age 3 and older who have previously received Tdap.  Recommend by the CDC.    Other orders/Medication refill -     albuterol (VENTOLIN HFA) 108 (90 Base) MCG/ACT inhaler; Inhale 2 puffs into the lungs every 6 (six) hours as needed for wheezing or shortness of breath. -     albuterol (PROVENTIL) (2.5 MG/3ML) 0.083% nebulizer solution; Take 3 mLs (2.5 mg total) by nebulization every 6 (six) hours as needed for wheezing or shortness of breath. -     escitalopram (LEXAPRO) 20 MG tablet; Take 1 tablet (20 mg total) by mouth daily. -     ipratropium (ATROVENT HFA) 17 MCG/ACT inhaler; Inhale 2  puffs into the lungs every 6 (six) hours as needed for wheezing.   Other orders -     albuterol (VENTOLIN HFA) 108 (90 Base) MCG/ACT inhaler; Inhale 2 puffs into the lungs every 6 (six) hours as needed for wheezing or shortness of breath. -     albuterol (PROVENTIL) (2.5 MG/3ML) 0.083% nebulizer solution; Take 3 mLs (2.5 mg total) by nebulization every 6 (six) hours as needed for wheezing or shortness of breath. -     escitalopram (LEXAPRO) 20 MG tablet; Take 1 tablet (20 mg total) by mouth daily. -     ipratropium (ATROVENT HFA) 17 MCG/ACT inhaler; Inhale 2 puffs into the lungs every 6 (six) hours as needed for wheezing.    Meds ordered this encounter  Medications  . albuterol (VENTOLIN HFA) 108 (90 Base) MCG/ACT inhaler    Sig: Inhale 2 puffs into the lungs every 6 (six) hours as needed for wheezing or shortness of breath.    Dispense:  6.7 g    Refill:  1  . albuterol (PROVENTIL) (2.5 MG/3ML) 0.083% nebulizer solution    Sig: Take 3 mLs (2.5 mg total) by nebulization every 6 (six) hours as needed for wheezing or shortness of breath.    Dispense:  75 mL    Refill:  1  . escitalopram (LEXAPRO) 20 MG tablet    Sig: Take 1 tablet (20 mg total) by mouth daily.    Dispense:  30 tablet    Refill:  3  . ipratropium (ATROVENT HFA) 17 MCG/ACT inhaler    Sig: Inhale 2 puffs into the lungs every 6 (six) hours as needed for wheezing.    Dispense:  1 Inhaler    Refill:  12    Follow-up: Return if symptoms worsen or fail to improve, for  Asthma.    Kerin Perna, NP

## 2019-01-02 NOTE — BH Specialist Note (Signed)
Integrated Behavioral Health Initial Visit  MRN: [id number] Name: Craig Woodward  Number of [place] Clinician visits:: 1/6 Session Start time: 10:30 AM  Session End time: 10:45 AM Total time: 15 minutes  Type of Service: [place] Interpretor:No. Interpretor Name and Language: NA   Warm Hand Off Completed.       SUBJECTIVE: Craig Woodward is a 38 y.o. male accompanied by self Patient was referred by NP for substance use. Patient reports the following symptoms/concerns: Pt reports hx of substance use Duration of problem: Ongoing; Severity of problem: na  OBJECTIVE: Mood: Anxious and Affect: Appropriate Risk of harm to self or others: No plan to harm self or others  LIFE CONTEXT: Family and Social: Pt reports having support School/Work: Pt did not disclose financial strain Self-Care: Pt reports hx of substance use Life Changes: Pt recently discharged from hospital due to medical conditions  GOALS ADDRESSED: Patient will: 1. Reduce symptoms of: stress 2. Increase knowledge and/or ability of: stress reduction  3. Demonstrate ability to: Decrease self-medicating behaviors  INTERVENTIONS: Interventions utilized: Link to [employer]  Standardized Assessments completed: Patient declined screening  ASSESSMENT: Patient currently experiencing stress triggered by pain. He reports hx of substance use. Denies SI/HI and receives support in the community   Patient may benefit from substance use treatment. He is not currently interested and has not followed up with [facility], as noted in AVS; however, allowed LCSW to provide supportive resources for behavioral health and substance use needs.   PLAN: 1. Follow up with behavioral health clinician on : Schedule follow up appointment 2. Behavioral recommendations: Utilize resources provided 3. Referral(s): [employer] logistics/support/administrative officer (LME/Outside Clinic) and Substance Abuse  Program 4. "From scale of 1-10, how likely are you to follow plan?":   Rebekah [place], LCSW 01/02/2019 9:55 AM

## 2019-01-14 ENCOUNTER — Other Ambulatory Visit: Payer: Self-pay

## 2019-01-14 ENCOUNTER — Encounter (INDEPENDENT_AMBULATORY_CARE_PROVIDER_SITE_OTHER): Payer: Self-pay | Admitting: Primary Care

## 2019-01-14 ENCOUNTER — Ambulatory Visit (INDEPENDENT_AMBULATORY_CARE_PROVIDER_SITE_OTHER): Payer: Self-pay | Admitting: Primary Care

## 2019-01-14 VITALS — BP 106/67 | HR 95 | Temp 97.3°F | Ht 72.0 in | Wt 161.6 lb

## 2019-01-14 DIAGNOSIS — Z720 Tobacco use: Secondary | ICD-10-CM

## 2019-01-14 DIAGNOSIS — Z131 Encounter for screening for diabetes mellitus: Secondary | ICD-10-CM

## 2019-01-14 DIAGNOSIS — F329 Major depressive disorder, single episode, unspecified: Secondary | ICD-10-CM

## 2019-01-14 DIAGNOSIS — F32A Depression, unspecified: Secondary | ICD-10-CM

## 2019-01-14 DIAGNOSIS — G629 Polyneuropathy, unspecified: Secondary | ICD-10-CM

## 2019-01-14 LAB — POCT GLYCOSYLATED HEMOGLOBIN (HGB A1C): Hemoglobin A1C: 5.4 % (ref 4.0–5.6)

## 2019-01-14 MED ORDER — GABAPENTIN 100 MG PO CAPS: 100.0000 mg | ORAL_CAPSULE | Freq: Three times a day (TID) | ORAL | 3 refills | Status: DC

## 2019-01-14 NOTE — Patient Instructions (Signed)
Peripheral neuropathy develops slowly over time leading to a loss of sensation. Burning, stabbing, or aching pain in the legs or feet are common symptoms.This can lead to calluses or sores on areas of constant pressure, reduced ability to feel temperature changes.

## 2019-01-14 NOTE — Progress Notes (Signed)
Established Patient Office Visit  Subjective:  Patient ID: Craig Woodward, male    DOB: 1981-01-10  Age: 38 y.o. MRN: [id number]  CC:  Chief Complaint  Patient presents with  . Tingling    both feet    HPI NICANOR MENDOLIA presents for numbness and tingling in his feet and painful with increase walking. Screened for diabetes A1C 5.4.  Past Medical History:  Diagnosis Date  . ADHD (attention deficit hyperactivity disorder) 12/31/2011  . Allergy   . Asthma    childhood  . Back pain     Past Surgical History:  Procedure Laterality Date  . tubes in ears    . TYMPANOSTOMY TUBE PLACEMENT      Family History  Problem Relation Age of Onset  . Heart disease Mother   . Stroke Mother   . Hypertension Mother   . Diabetes Mother   . Hypertension Father   . Diabetes Father   . Hypertension Maternal Aunt   . Heart disease Maternal Grandmother   . Cancer Maternal Grandfather        lung CA  . Diabetes Sister     Social History   Socioeconomic History  . Marital status: Divorced    Spouse name: Not on file  . Number of children: Not on file  . Years of education: Not on file  . Highest education level: Not on file  Occupational History  . Not on file  Social Needs  . Financial resource strain: Not on file  . Food insecurity    Worry: Not on file    Inability: Not on file  . Transportation needs    Medical: Not on file    Non-medical: Not on file  Tobacco Use  . Smoking status: Current Every Day Smoker    Packs/day: 1.00  . Smokeless tobacco: Never Used  Substance and Sexual Activity  . Alcohol use: Yes    Comment: 12 to 18 beers  . Drug use: Yes    Types: Cocaine    Comment: herione and cocaine a few days ago   . Sexual activity: Not on file  Lifestyle  . Physical activity    Days per week: Not on file    Minutes per session: Not on file  . Stress: Not on file  Relationships  . Social Herbalist on phone: Not on file    Gets together: Not  on file    Attends religious service: Not on file    Active member of club or organization: Not on file    Attends meetings of clubs or organizations: Not on file    Relationship status: Not on file  . Intimate partner violence    Fear of current or ex partner: Not on file    Emotionally abused: Not on file    Physically abused: Not on file    Forced sexual activity: Not on file  Other Topics Concern  . Not on file  Social History Narrative   Reports history of excessive Xanax use in past.    Outpatient Medications Prior to Visit  Medication Sig Dispense Refill  . acetaminophen (TYLENOL) 500 MG tablet Take 1,000 mg by mouth every 6 (six) hours as needed for moderate pain.    Marland Kitchen albuterol (PROVENTIL) (2.5 MG/3ML) 0.083% nebulizer solution Take 3 mLs (2.5 mg total) by nebulization every 6 (six) hours as needed for wheezing or shortness of breath. 75 mL 1  . albuterol (VENTOLIN HFA) 108 (  90 Base) MCG/ACT inhaler Inhale 2 puffs into the lungs every 6 (six) hours as needed for wheezing or shortness of breath. 6.7 g 1  . escitalopram (LEXAPRO) 20 MG tablet Take 1 tablet (20 mg total) by mouth daily. 30 tablet 3  . ipratropium (ATROVENT HFA) 17 MCG/ACT inhaler Inhale 2 puffs into the lungs every 6 (six) hours as needed for wheezing. 1 Inhaler 12  . naloxone (NARCAN) nasal spray 4 mg/0.1 mL Use in the event of suspected overdose (Patient not taking: Reported on 01/14/2019) 1 kit 0  . amoxicillin (AMOXIL) 500 MG capsule Take 1 capsule (500 mg total) by mouth 3 (three) times daily. (Patient not taking: Reported on 12/05/2018) 21 capsule 0  . erythromycin with ethanol (EMGEL) 2 % gel Apply topically daily. (Patient not taking: Reported on 12/05/2018) 30 g 0  . methocarbamol (ROBAXIN) 500 MG tablet Take 2 tablets (1,000 mg total) by mouth every 8 (eight) hours as needed for muscle spasms. (Patient not taking: Reported on 12/05/2018) 30 tablet 0  . predniSONE (DELTASONE) 20 MG tablet Take 2 tablets (40 mg  total) by mouth daily. (Patient not taking: Reported on 12/05/2018) 10 tablet 0   No facility-administered medications prior to visit.     Allergies  Allergen Reactions  . Penicillin G Anaphylaxis    Airway swelling Per pt: he thinks he is allergic    ROS Review of Systems  Neurological: Positive for weakness and numbness.       Feet  All other systems reviewed and are negative.     Objective:    Physical Exam  Constitutional: He is oriented to person, place, and time. He appears well-developed and well-nourished.  HENT:  Head: Normocephalic.  Neck: Normal range of motion.  Cardiovascular: Normal rate and regular rhythm.  Pulmonary/Chest: Effort normal and breath sounds normal.  Abdominal: Soft. Bowel sounds are normal.  Musculoskeletal: Normal range of motion.  Neurological: He is oriented to person, place, and time.  Skin: Skin is warm.  Psychiatric: He has a normal mood and affect.    BP 106/67 (BP Location: Right Arm, Patient Position: Sitting, Cuff Size: Normal)   Pulse 95   Temp (!) 97.3 F (36.3 C) (Temporal)   Ht 6' (1.829 m)   Wt 161 lb 9.6 oz (73.3 kg)   SpO2 93%   BMI 21.92 kg/m  Wt Readings from Last 3 Encounters:  01/14/19 161 lb 9.6 oz (73.3 kg)  12/07/18 147 lb 0.8 oz (66.7 kg)  07/12/17 180 lb (81.6 kg)     Health Maintenance Due  Topic Date Due  . TETANUS/TDAP  09/18/2001  . INFLUENZA VACCINE  11/01/2018    There are no preventive care reminders to display for this patient.  No results found for: TSH Lab Results  Component Value Date   WBC 10.9 (H) 12/09/2018   HGB 14.7 12/09/2018   HCT 45.0 12/09/2018   MCV 90.4 12/09/2018   PLT 256 12/09/2018   Lab Results  Component Value Date   NA 140 12/09/2018   K 3.2 (L) 12/09/2018   CO2 30 12/09/2018   GLUCOSE 143 (H) 12/09/2018   BUN 17 12/09/2018   CREATININE 0.92 12/09/2018   BILITOT 0.6 12/05/2018   ALKPHOS 71 12/05/2018   AST 99 (H) 12/05/2018   ALT 53 (H) 12/05/2018   PROT  6.7 12/05/2018   ALBUMIN 3.8 12/05/2018   CALCIUM 8.5 (L) 12/09/2018   ANIONGAP 6 12/09/2018   No results found for: CHOL No results  found for: HDL No results found for: LDLCALC No results found for: TRIG No results found for: [facility] Lab Results  Component Value Date   HGBA1C 5.4 01/14/2019      Jeff was seen today for tingling.  Diagnoses and all orders for this visit:  Screening for diabetes mellitus Informed patient he was not a diabetic . -     HgB A1c 5.4  Neuropathy Numbness and pain feels like pen and needles unknown etiology . Started  low dose of  gabapentin (NEURONTIN) 100 MG capsule; Take 1 capsule (100 mg total) by mouth 3 (three) times daily for neuropathic pain  Depression, unspecified depression type Evaluated effective of medication he has notice subtle changes and feels better most days. Continue Lexapro '20mg'$ .  at night. Continue to follow up with CSW  Tobacco abuse  Nicotine affect every organ in the body second leading cause of death.  Increased risk for lung cancer and other respiratory diseases recommend cessation.  This will be reminded at each clinical visit.  Other orders -     gabapentin (NEURONTIN) 100 MG capsule; Take 1 capsule (100 mg total) by mouth 3 (three) times daily.   Meds ordered this encounter  Medications  . gabapentin (NEURONTIN) 100 MG capsule    Sig: Take 1 capsule (100 mg total) by mouth 3 (three) times daily.    Dispense:  90 capsule    Refill:  3    Follow-up: Return if symptoms worsen or fail to improve.    Kerin Perna, NP

## 2019-02-04 ENCOUNTER — Telehealth (INDEPENDENT_AMBULATORY_CARE_PROVIDER_SITE_OTHER): Payer: Self-pay

## 2019-02-04 NOTE — Telephone Encounter (Signed)
Patient called stating he is always in pain. He is taking gabapentin other than directed. He is taking 2 tabs TID. States he is having uncontrollable jerking and also talking in his sleep. Wants to know if it is a side effect of the medication? Would like to know if he can be prescribed something else for neuropathy. Please advise. Nat Christen, CMA

## 2019-02-06 ENCOUNTER — Other Ambulatory Visit (INDEPENDENT_AMBULATORY_CARE_PROVIDER_SITE_OTHER): Payer: Self-pay | Admitting: Primary Care

## 2019-02-06 DIAGNOSIS — G629 Polyneuropathy, unspecified: Secondary | ICD-10-CM

## 2019-02-06 MED ORDER — AMITRIPTYLINE HCL 75 MG PO TABS: 75.0000 mg | ORAL_TABLET | Freq: Every day | ORAL | 3 refills | Status: DC

## 2019-02-18 ENCOUNTER — Ambulatory Visit: Payer: Self-pay | Admitting: Podiatry

## 2019-03-01 IMAGING — CR DG CHEST 2V
1 series · 2 of 2 positions shown · non-contrast
Comparison: None.

CLINICAL DATA: Shortness of breath.  Chest pain.

EXAM:
CHEST  2 VIEW

[Series 1: dg chest 2 view · 0.14mm/px · 2 of 2 slices shown]
[im 1/2]
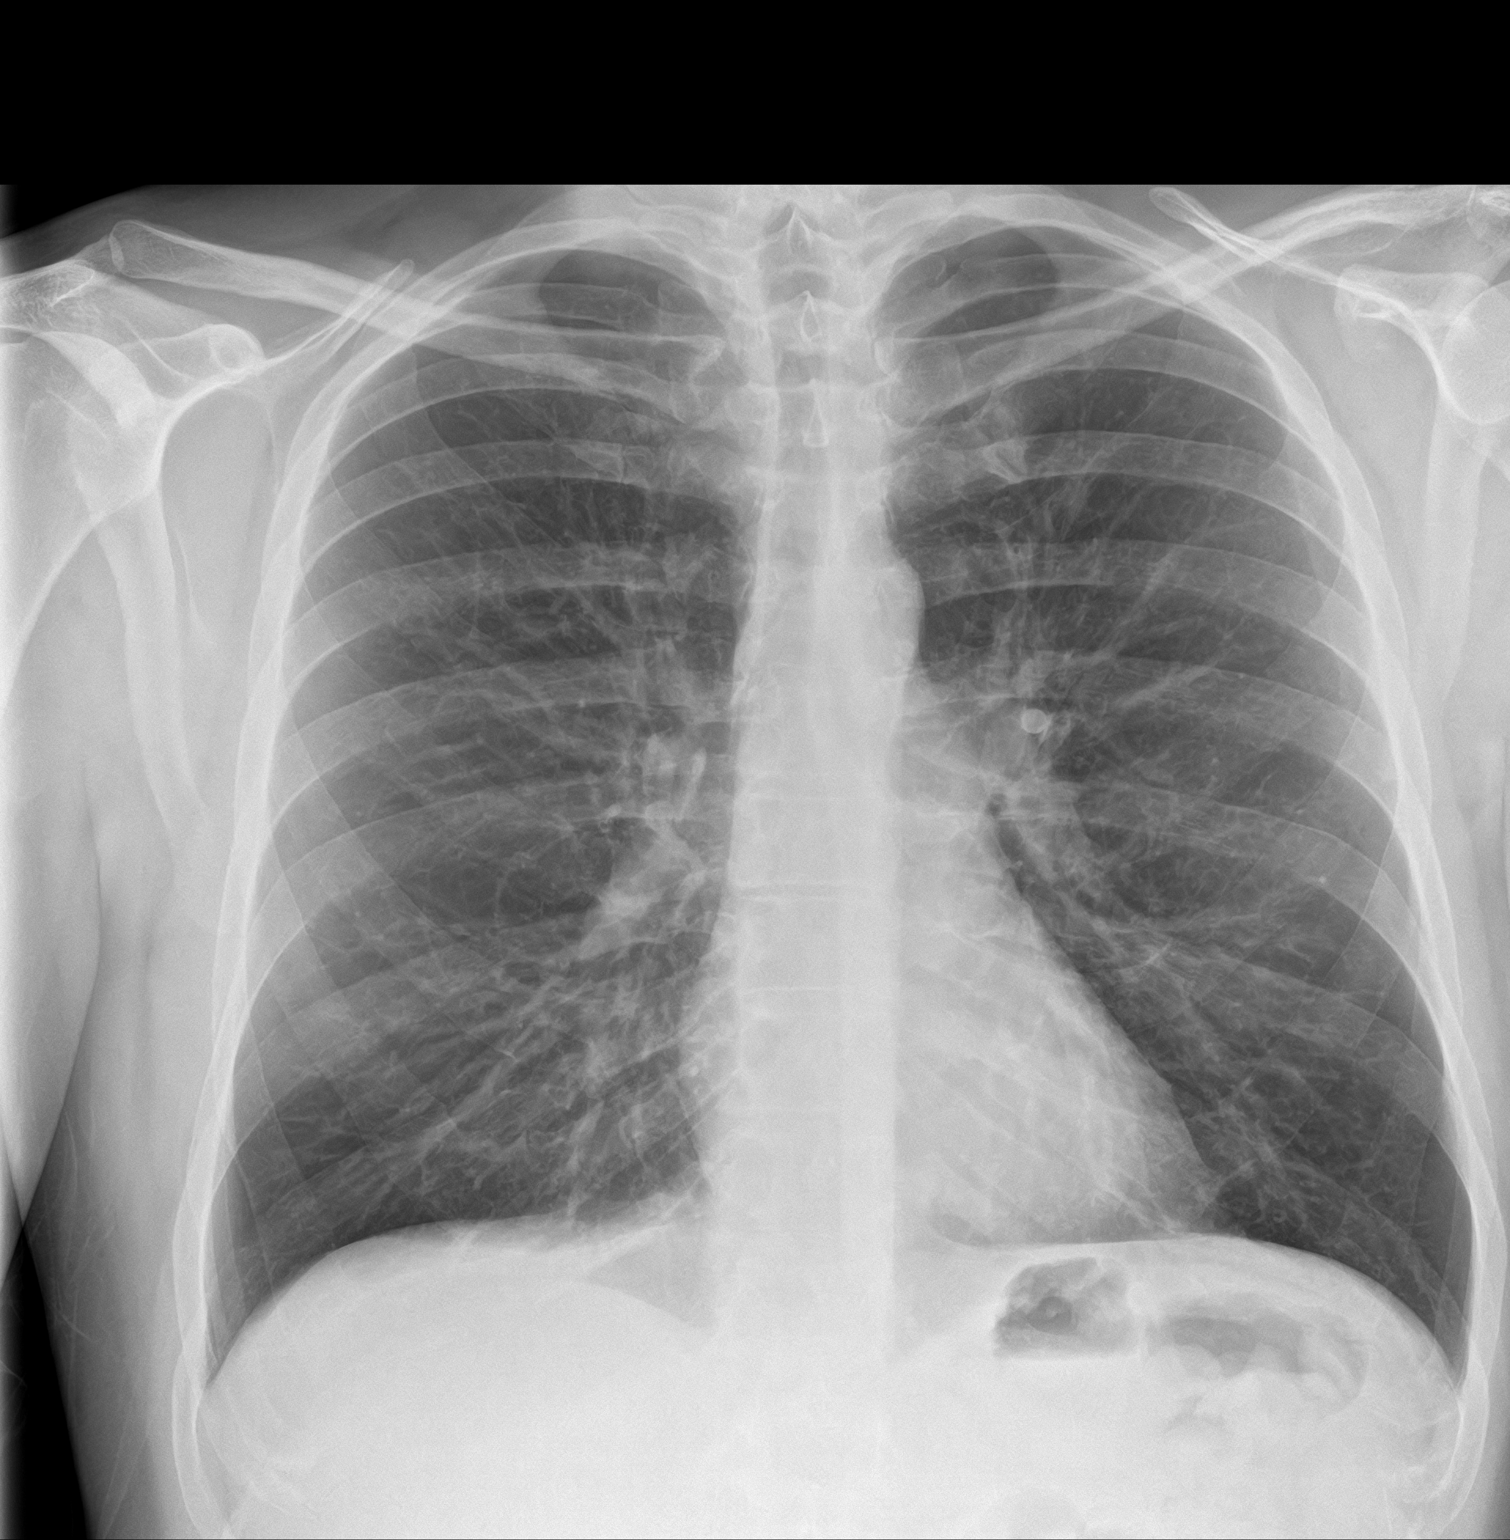
[im 2/2]
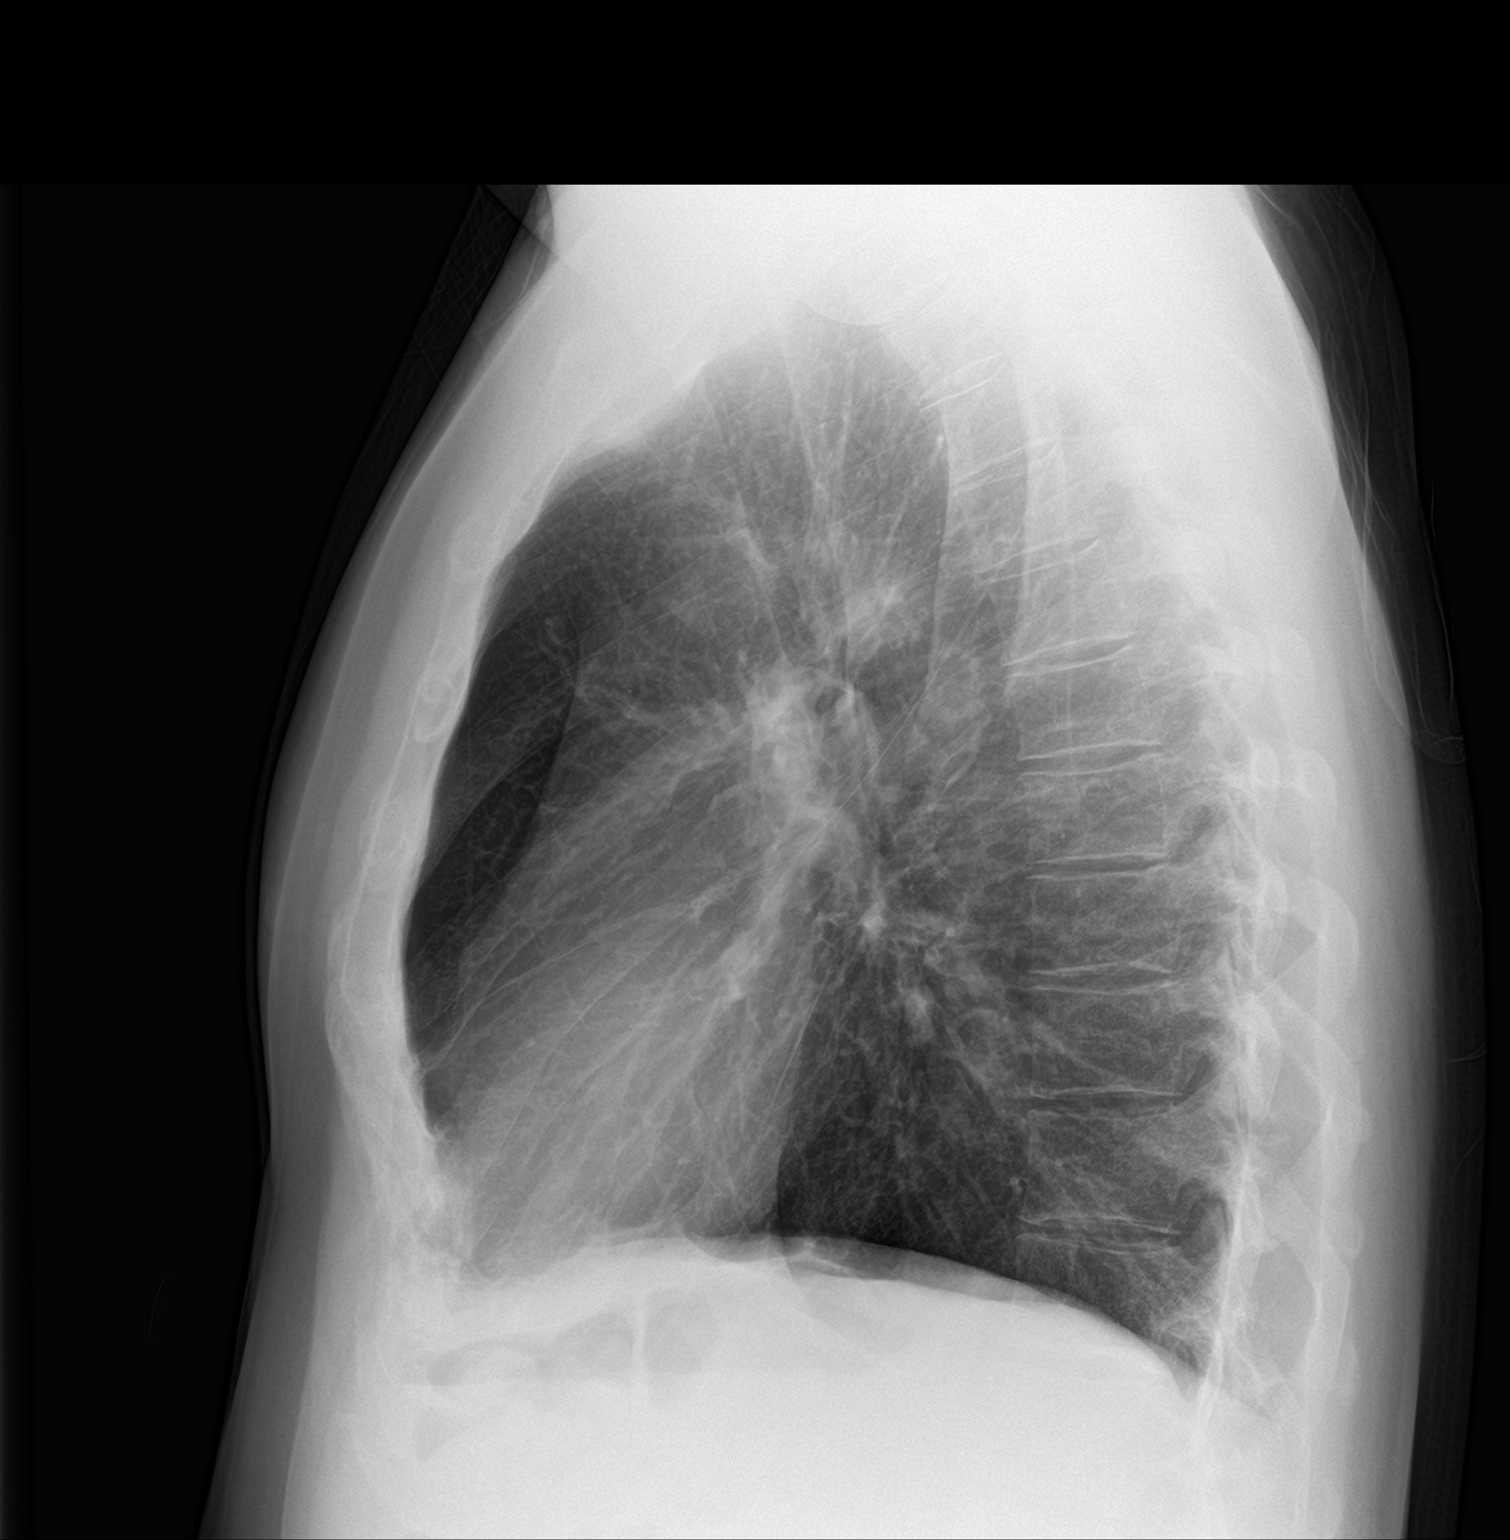

[2 of 2 positions shown; findings below may reference images not displayed]

FINDINGS: The heart size and mediastinal contours are within normal limits.
Both lungs are clear. The visualized skeletal structures are
unremarkable.
IMPRESSION: No active cardiopulmonary disease.

## 2019-06-03 ENCOUNTER — Other Ambulatory Visit (INDEPENDENT_AMBULATORY_CARE_PROVIDER_SITE_OTHER): Payer: Self-pay | Admitting: Primary Care

## 2019-06-03 NOTE — Telephone Encounter (Signed)
Sent to PCP ?

## 2019-06-05 ENCOUNTER — Other Ambulatory Visit (INDEPENDENT_AMBULATORY_CARE_PROVIDER_SITE_OTHER): Payer: Self-pay | Admitting: Primary Care

## 2019-06-05 MED ORDER — ESCITALOPRAM OXALATE 20 MG PO TABS: 20.0000 mg | ORAL_TABLET | Freq: Every day | ORAL | 1 refills | Status: DC

## 2020-01-15 ENCOUNTER — Other Ambulatory Visit (INDEPENDENT_AMBULATORY_CARE_PROVIDER_SITE_OTHER): Payer: Self-pay | Admitting: Primary Care

## 2020-01-15 NOTE — Telephone Encounter (Signed)
Requested medications are due for refill today yes  Requested medications are on the active medication list yes  Last refill 12/19/19  Last visit 01/2019  Future visit scheduled no  Notes to clinic Failed protocol of visit within 6 months and no scheduled visit, please assess.

## 2020-01-19 ENCOUNTER — Other Ambulatory Visit (INDEPENDENT_AMBULATORY_CARE_PROVIDER_SITE_OTHER): Payer: Self-pay | Admitting: Primary Care

## 2020-01-23 ENCOUNTER — Other Ambulatory Visit (INDEPENDENT_AMBULATORY_CARE_PROVIDER_SITE_OTHER): Payer: Self-pay | Admitting: Primary Care

## 2020-01-23 NOTE — Telephone Encounter (Signed)
Requested Prescriptions  Pending Prescriptions Disp Refills  . escitalopram (LEXAPRO) 20 MG tablet [Pharmacy Med Name: ESCITALOPRAM 20 MG TABLET] 11 tablet 0    Sig: TAKE 1 TABLET BY MOUTH EVERY DAY     Psychiatry:  Antidepressants - SSRI Failed - 01/23/2020  6:34 PM      Failed - Valid encounter within last 6 months    Recent Outpatient Visits          1 year ago Screening for diabetes mellitus   [facility] Grayce Sessions, NP   1 year ago Encounter to establish care   [facility] Grayce Sessions, NP   7 years ago Cough   [facility], Dema Severin, PA-C   7 years ago Cough   [facility], Newt Lukes, MD      Future Appointments            In 1 week Grayce Sessions, NP [facility]

## 2020-02-02 ENCOUNTER — Ambulatory Visit (INDEPENDENT_AMBULATORY_CARE_PROVIDER_SITE_OTHER): Payer: Self-pay | Admitting: Primary Care

## 2020-03-10 ENCOUNTER — Ambulatory Visit (INDEPENDENT_AMBULATORY_CARE_PROVIDER_SITE_OTHER): Payer: Self-pay

## 2020-03-10 NOTE — Telephone Encounter (Signed)
Patient called with help of girl friend. His speech is very slurred he has headache, blurred vision. Legs hurt. He is off balance. Per Ms Marga Hoots he has been getting worse for about 2 weeks.  She states that he had this once before but they never diagnosed anything. He has breathing problems that are in his normal range per Ms Marga Hoots. Patient had asthma. Per protocol patient will go to ER for evaluation. He expressed concern about going but did agree and Ms Marga Hoots states she will try hard to get him to go. I explained to patient that symptoms could become permanent  If not treated promptly. He and she verbalized understanding.   Reason for Disposition . Headache  (and neurologic deficit)  Answer Assessment - Initial Assessment Questions 1. SYMPTOM: "What is the main symptom you are concerned about?" (e.g., weakness, numbness)     Dizzy slurred speech Legs hurts 2. ONSET: "When did this start?" (minutes, hours, days; while sleeping)     2 weeks ago 3. LAST NORMAL: "When was the last time you were normal (no symptoms)?"    2 weeks ago 4. PATTERN "Does this come and go, or has it been constant since it started?"  "Is it present now?"     Present now  Bur mostly stays 5. CARDIAC SYMPTOMS: "Have you had any of the following symptoms: chest pain, difficulty breathing, palpitations?"     Breathing problems are normal 6. NEUROLOGIC SYMPTOMS: "Have you had any of the following symptoms: headache, dizziness, vision loss, double vision, changes in speech, unsteady on your feet?"     Change in speech double vision headaches 7. OTHER SYMPTOMS: "Do you have any other symptoms?"     No 8. PREGNANCY: "Is there any chance you are pregnant?" "When was your last menstrual period?"   N/A  Protocols used: NEUROLOGIC DEFICIT-A-AH

## 2020-07-31 ENCOUNTER — Other Ambulatory Visit (INDEPENDENT_AMBULATORY_CARE_PROVIDER_SITE_OTHER): Payer: Self-pay | Admitting: Primary Care

## 2020-07-31 NOTE — Telephone Encounter (Signed)
Called pt and appt set up.  Requested Prescriptions  Pending Prescriptions Disp Refills  . escitalopram (LEXAPRO) 20 MG tablet [Pharmacy Med Name: ESCITALOPRAM 20 MG TAB] 12 tablet 0    Sig: TAKE ONE TABLET BY MOUTH ONE TIME DAILY     Psychiatry:  Antidepressants - SSRI Failed - 07/31/2020 12:14 AM      Failed - Valid encounter within last 6 months    Recent Outpatient Visits          1 year ago Screening for diabetes mellitus   [facility] Grayce Sessions, NP   1 year ago Encounter to establish care   [facility] Grayce Sessions, NP   8 years ago Cough   [facility], Dema Severin, PA-C   8 years ago Cough   [facility], Newt Lukes, MD      Future Appointments            In 1 week Grayce Sessions, NP [facility]

## 2020-08-11 ENCOUNTER — Encounter (INDEPENDENT_AMBULATORY_CARE_PROVIDER_SITE_OTHER): Payer: Self-pay | Admitting: Primary Care

## 2020-08-11 ENCOUNTER — Other Ambulatory Visit: Payer: Self-pay

## 2020-08-11 ENCOUNTER — Ambulatory Visit (INDEPENDENT_AMBULATORY_CARE_PROVIDER_SITE_OTHER): Payer: Self-pay | Admitting: Primary Care

## 2020-08-11 VITALS — BP 118/80 | HR 86 | Temp 97.5°F | Ht 72.0 in | Wt 202.2 lb

## 2020-08-11 DIAGNOSIS — Z79899 Other long term (current) drug therapy: Secondary | ICD-10-CM

## 2020-08-11 DIAGNOSIS — J4550 Severe persistent asthma, uncomplicated: Secondary | ICD-10-CM

## 2020-08-11 DIAGNOSIS — F339 Major depressive disorder, recurrent, unspecified: Secondary | ICD-10-CM

## 2020-08-11 DIAGNOSIS — Z131 Encounter for screening for diabetes mellitus: Secondary | ICD-10-CM

## 2020-08-11 DIAGNOSIS — F17200 Nicotine dependence, unspecified, uncomplicated: Secondary | ICD-10-CM

## 2020-08-11 DIAGNOSIS — Z720 Tobacco use: Secondary | ICD-10-CM

## 2020-08-11 DIAGNOSIS — Z23 Encounter for immunization: Secondary | ICD-10-CM

## 2020-08-11 DIAGNOSIS — R42 Dizziness and giddiness: Secondary | ICD-10-CM

## 2020-08-11 DIAGNOSIS — Z760 Encounter for issue of repeat prescription: Secondary | ICD-10-CM

## 2020-08-11 MED ORDER — ATROVENT HFA 17 MCG/ACT IN AERS: 2.0000 | INHALATION_SPRAY | Freq: Four times a day (QID) | RESPIRATORY_TRACT | 12 refills | Status: DC | PRN

## 2020-08-11 MED ORDER — ALBUTEROL SULFATE HFA 108 (90 BASE) MCG/ACT IN AERS: 2.0000 | INHALATION_SPRAY | Freq: Four times a day (QID) | RESPIRATORY_TRACT | 1 refills | Status: DC | PRN

## 2020-08-11 MED ORDER — ESCITALOPRAM OXALATE 20 MG PO TABS: 1.0000 | ORAL_TABLET | Freq: Every day | ORAL | 1 refills | Status: DC

## 2020-08-11 NOTE — Progress Notes (Signed)
Established Patient Office Visit  Subjective:  Patient ID: Craig Woodward, male    DOB: 1980/04/24  Age: 40 y.o. MRN: [id number]  CC:  Chief Complaint  Patient presents with  . Medication Refill    HPI  [name] is a 40 year old male who presents for medication refills.  Last visit January 14, 2019.  Refill medications to exact date to hopefully ensure he kept this appointment and he did. He voices no problems complaints or concerns at this time Past Medical History:  Diagnosis Date  . ADHD (attention deficit hyperactivity disorder) 12/31/2011  . Allergy   . Asthma    childhood  . Back pain     Past Surgical History:  Procedure Laterality Date  . tubes in ears    . TYMPANOSTOMY TUBE PLACEMENT      Family History  Problem Relation Age of Onset  . Heart disease Mother   . Stroke Mother   . Hypertension Mother   . Diabetes Mother   . Hypertension Father   . Diabetes Father   . Hypertension Maternal Aunt   . Heart disease Maternal Grandmother   . Cancer Maternal Grandfather        lung CA  . Diabetes Sister     Social History   Socioeconomic History  . Marital status: Divorced    Spouse name: Not on file  . Number of children: Not on file  . Years of education: Not on file  . Highest education level: Not on file  Occupational History  . Not on file  Tobacco Use  . Smoking status: Current Every Day Smoker    Packs/day: 1.00  . Smokeless tobacco: Never Used  Substance and Sexual Activity  . Alcohol use: Yes    Comment: 12 to 18 beers  . Drug use: Yes    Types: Cocaine    Comment: herione and cocaine a few days ago   . Sexual activity: Not on file  Other Topics Concern  . Not on file  Social History Narrative   Reports history of excessive Xanax use in past.   Social Determinants of Health   Financial Resource Strain: Not on file  Food Insecurity: Not on file  Transportation Needs: Not on file  Physical Activity: Not on file  Stress:  Not on file  Social Connections: Not on file  Intimate Partner Violence: Not on file    Outpatient Medications Prior to Visit  Medication Sig Dispense Refill  . acetaminophen (TYLENOL) 500 MG tablet Take 1,000 mg by mouth every 6 (six) hours as needed for moderate pain.    Marland Kitchen albuterol (PROVENTIL) (2.5 MG/3ML) 0.083% nebulizer solution Take 3 mLs (2.5 mg total) by nebulization every 6 (six) hours as needed for wheezing or shortness of breath. 75 mL 1  . albuterol (VENTOLIN HFA) 108 (90 Base) MCG/ACT inhaler Inhale 2 puffs into the lungs every 6 (six) hours as needed for wheezing or shortness of breath. 6.7 g 1  . escitalopram (LEXAPRO) 20 MG tablet TAKE ONE TABLET BY MOUTH ONE TIME DAILY 12 tablet 0  . ipratropium (ATROVENT HFA) 17 MCG/ACT inhaler Inhale 2 puffs into the lungs every 6 (six) hours as needed for wheezing. 1 Inhaler 12  . naloxone (NARCAN) nasal spray 4 mg/0.1 mL Use in the event of suspected overdose (Patient not taking: No sig reported) 1 kit 0   No facility-administered medications prior to visit.    Allergies  Allergen Reactions  . Penicillin G Anaphylaxis  Airway swelling Per pt: he thinks he is allergic    ROS Review of Systems  HENT: Positive for congestion.   Neurological: Positive for dizziness.       1 month- first episode 2015 taking dramamine that helps  Psychiatric/Behavioral:       Depression managed on SSRI  All other systems reviewed and are negative.     Objective:    Physical Exam Vitals reviewed.  Constitutional:      Appearance: Normal appearance.  HENT:     Head: Normocephalic.     Right Ear: Tympanic membrane normal.     Left Ear: Tympanic membrane normal.     Nose: Nose normal.  Cardiovascular:     Rate and Rhythm: Normal rate and regular rhythm.  Pulmonary:     Effort: Pulmonary effort is normal.     Breath sounds: Normal breath sounds.  Abdominal:     General: Bowel sounds are normal.     Palpations: Abdomen is soft.   Musculoskeletal:        General: Normal range of motion.     Cervical back: Normal range of motion.  Skin:    General: Skin is warm and dry.  Neurological:     Mental Status: He is alert and oriented to person, place, and time.  Psychiatric:        Mood and Affect: Mood normal.        Behavior: Behavior normal.        Thought Content: Thought content normal.        Judgment: Judgment normal.     BP 118/80 (BP Location: Right Arm, Patient Position: Sitting, Cuff Size: Large)   Pulse 86   Temp (!) 97.5 F (36.4 C) (Temporal)   Ht 6' (1.829 m)   Wt 202 lb 3.2 oz (91.7 kg)   SpO2 93%   BMI 27.42 kg/m  Wt Readings from Last 3 Encounters:  08/11/20 202 lb 3.2 oz (91.7 kg)  01/14/19 161 lb 9.6 oz (73.3 kg)  12/07/18 147 lb 0.8 oz (66.7 kg)     Health Maintenance Due  Topic Date Due  . COVID-19 Vaccine (3 - Booster for Pfizer series) 06/10/2020    There are no preventive care reminders to display for this patient.  No results found for: TSH Lab Results  Component Value Date   WBC 6.3 08/11/2020   HGB 14.1 08/11/2020   HCT 41.5 08/11/2020   MCV 87 08/11/2020   PLT 249 08/11/2020   Lab Results  Component Value Date   NA 138 08/11/2020   K 4.4 08/11/2020   CO2 27 08/11/2020   GLUCOSE 83 08/11/2020   BUN 20 08/11/2020   CREATININE 0.72 (L) 08/11/2020   BILITOT 0.2 08/11/2020   ALKPHOS 88 08/11/2020   AST 16 08/11/2020   ALT 20 08/11/2020   PROT 6.7 08/11/2020   ALBUMIN 4.4 08/11/2020   CALCIUM 8.9 08/11/2020   ANIONGAP 6 12/09/2018   EGFR 121 08/11/2020   Lab Results  Component Value Date   CHOL 168 08/11/2020   Lab Results  Component Value Date   HDL 64 08/11/2020   Lab Results  Component Value Date   LDLCALC 90 08/11/2020   Lab Results  Component Value Date   TRIG 76 08/11/2020   Lab Results  Component Value Date   CHOLHDL 2.6 08/11/2020   Lab Results  Component Value Date   HGBA1C 5.6 08/11/2020      Assessment & Plan:  Craig Woodward was  seen today for medication refill.  Diagnoses and all orders for this visit:  Need for Tdap vaccination -     Tdap vaccine greater than or equal to 7yo IM  Dizziness and giddiness  Episode of recurrent major depressive disorder, unspecified depression episode severity (HCC)  Tobacco abuse Nicotine affect every organ in the body second leading cause of death.  Increased risk for lung cancer and other respiratory diseases recommend cessation.  This will be reminded at each clinical visit. Not interested at this time .   Severe persistent asthma without complication Manage with     ipratropium (ATROVENT HFA) 17 MCG/ACT inhaler; Inhale 2 puffs into the lungs every 6 (six) hours as needed for wheezing. -     albuterol (VENTOLIN HFA) 108 (90 Base) MCG/ACT inhaler; Inhale 2 puffs into the lungs every 6 (six) hours as needed for wheezing or shortness of breath.   Medication refill -     escitalopram (LEXAPRO) 20 MG tablet; Take 1 tablet (20 mg total) by mouth daily. -     ipratropium (ATROVENT HFA) 17 MCG/ACT inhaler; Inhale 2 puffs into the lungs every 6 (six) hours as needed for wheezing. -     albuterol (VENTOLIN HFA) 108 (90 Base) MCG/ACT inhaler; Inhale 2 puffs into the lungs every 6 (six) hours as needed for wheezing or shortness of breath.  Diabetes mellitus screening -     Hemoglobin A1c 5.6 Discussed Prediabetes is 5.7 -6.4 start monitoring foods that are high in carbohydrates are the following rice, potatoes, breads, sugars, and pastas.  Reduction in the intake (eating) will assist in lowering your blood sugars.  Medication management -     CBC with Differential -     CMP14+EGFR -     Lipid Panel    Follow-up: No follow-ups on file.    Kerin Perna, NP

## 2020-08-11 NOTE — Patient Instructions (Signed)
Tdap (Tetanus, Diphtheria, Pertussis) Vaccine: What You Need to Know 1. Why get vaccinated? Tdap vaccine can prevent tetanus, diphtheria, and pertussis. Diphtheria and pertussis spread from person to person. Tetanus enters the body through cuts or wounds.  TETANUS (T) causes painful stiffening of the muscles. Tetanus can lead to serious health problems, including being unable to open the mouth, having trouble swallowing and breathing, or death.  DIPHTHERIA (D) can lead to difficulty breathing, heart failure, paralysis, or death.  PERTUSSIS (aP), also known as "whooping cough," can cause uncontrollable, violent coughing that makes it hard to breathe, eat, or drink. Pertussis can be extremely serious especially in babies and young children, causing pneumonia, convulsions, brain damage, or death. In teens and adults, it can cause weight loss, loss of bladder control, passing out, and rib fractures from severe coughing. 2. Tdap vaccine Tdap is only for children 7 years and older, adolescents, and adults.  Adolescents should receive a single dose of Tdap, preferably at age 11 or 12 years. Pregnant people should get a dose of Tdap during every pregnancy, preferably during the early part of the third trimester, to help protect the newborn from pertussis. Infants are most at risk for severe, life-threatening complications from pertussis. Adults who have never received Tdap should get a dose of Tdap. Also, adults should receive a booster dose of either Tdap or Td (a different vaccine that protects against tetanus and diphtheria but not pertussis) every 10 years, or after 5 years in the case of a severe or dirty wound or burn. Tdap may be given at the same time as other vaccines. 3. Talk with your health care provider Tell your vaccine provider if the person getting the vaccine:  Has had an allergic reaction after a previous dose of any vaccine that protects against tetanus, diphtheria, or pertussis, or  has any severe, life-threatening allergies  Has had a coma, decreased level of consciousness, or prolonged seizures within 7 days after a previous dose of any pertussis vaccine (DTP, DTaP, or Tdap)  Has seizures or another nervous system problem  Has ever had Guillain-Barr Syndrome (also called "GBS")  Has had severe pain or swelling after a previous dose of any vaccine that protects against tetanus or diphtheria In some cases, your health care provider may decide to postpone Tdap vaccination until a future visit. People with minor illnesses, such as a cold, may be vaccinated. People who are moderately or severely ill should usually wait until they recover before getting Tdap vaccine.  Your health care provider can give you more information. 4. Risks of a vaccine reaction  Pain, redness, or swelling where the shot was given, mild fever, headache, feeling tired, and nausea, vomiting, diarrhea, or stomachache sometimes happen after Tdap vaccination. People sometimes faint after medical procedures, including vaccination. Tell your provider if you feel dizzy or have vision changes or ringing in the ears.  As with any medicine, there is a very remote chance of a vaccine causing a severe allergic reaction, other serious injury, or death. 5. What if there is a serious problem? An allergic reaction could occur after the vaccinated person leaves the clinic. If you see signs of a severe allergic reaction (hives, swelling of the face and throat, difficulty breathing, a fast heartbeat, dizziness, or weakness), call 9-1-1 and get the person to the nearest hospital. For other signs that concern you, call your health care provider.  Adverse reactions should be reported to the Vaccine Adverse Event Reporting System (VAERS). Your health   care provider will usually file this report, or you can do it yourself. Visit the VAERS website at www.vaers.hhs.gov or call 1-800-822-7967. VAERS is only for reporting  reactions, and VAERS staff members do not give medical advice. 6. The National Vaccine Injury Compensation Program The National Vaccine Injury Compensation Program (VICP) is a federal program that was created to compensate people who may have been injured by certain vaccines. Claims regarding alleged injury or death due to vaccination have a time limit for filing, which may be as short as two years. Visit the VICP website at www.hrsa.gov/vaccinecompensation or call 1-800-338-2382 to learn about the program and about filing a claim. 7. How can I learn more?  Ask your health care provider.  Call your local or state health department.  Visit the website of the Food and Drug Administration (FDA) for vaccine package inserts and additional information at www.fda.gov/vaccines-blood-biologics/vaccines.  Contact the Centers for Disease Control and Prevention (CDC): ? Call 1-800-232-4636 (1-800-CDC-INFO) or ? Visit CDC's website at www.cdc.gov/vaccines. Vaccine Information Statement Tdap (Tetanus, Diphtheria, Pertussis) Vaccine (11/06/2019) This information is not intended to replace advice given to you by your health care provider. Make sure you discuss any questions you have with your health care provider. Document Revised: 12/02/2019 Document Reviewed: 12/02/2019 Elsevier Patient Education  2021 Elsevier Inc.  

## 2020-08-12 LAB — CBC WITH DIFFERENTIAL/PLATELET
Basophils Absolute: 0.1 10*3/uL (ref 0.0–0.2)
Basos: 1 %
EOS (ABSOLUTE): 0.7 10*3/uL — ABNORMAL HIGH (ref 0.0–0.4)
Eos: 11 %
Hematocrit: 41.5 % (ref 37.5–51.0)
Hemoglobin: 14.1 g/dL (ref 13.0–17.7)
Immature Grans (Abs): 0 10*3/uL (ref 0.0–0.1)
Immature Granulocytes: 0 %
Lymphocytes Absolute: 2.7 10*3/uL (ref 0.7–3.1)
Lymphs: 43 %
MCH: 29.5 pg (ref 26.6–33.0)
MCHC: 34 g/dL (ref 31.5–35.7)
MCV: 87 fL (ref 79–97)
Monocytes Absolute: 0.5 10*3/uL (ref 0.1–0.9)
Monocytes: 7 %
Neutrophils Absolute: 2.4 10*3/uL (ref 1.4–7.0)
Neutrophils: 38 %
Platelets: 249 10*3/uL (ref 150–450)
RBC: 4.78 x10E6/uL (ref 4.14–5.80)
RDW: 13 % (ref 11.6–15.4)
WBC: 6.3 10*3/uL (ref 3.4–10.8)

## 2020-08-12 LAB — LIPID PANEL
Chol/HDL Ratio: 2.6 ratio (ref 0.0–5.0)
Cholesterol, Total: 168 mg/dL (ref 100–199)
HDL: 64 mg/dL (ref >39)
LDL Chol Calc (NIH): 90 mg/dL (ref 0–99)
Triglycerides: 76 mg/dL (ref 0–149)
VLDL Cholesterol Cal: 14 mg/dL (ref 5–40)

## 2020-08-12 LAB — HEMOGLOBIN A1C
Est. average glucose Bld gHb Est-mCnc: 114 mg/dL
Hgb A1c MFr Bld: 5.6 % (ref 4.8–5.6)

## 2020-08-12 LAB — CMP14+EGFR
ALT: 20 IU/L (ref 0–44)
AST: 16 IU/L (ref 0–40)
Albumin/Globulin Ratio: 1.9 (ref 1.2–2.2)
Albumin: 4.4 g/dL (ref 4.0–5.0)
Alkaline Phosphatase: 88 IU/L (ref 44–121)
BUN/Creatinine Ratio: 28 — ABNORMAL HIGH (ref 9–20)
BUN: 20 mg/dL (ref 6–20)
Bilirubin Total: 0.2 mg/dL (ref 0.0–1.2)
CO2: 27 mmol/L (ref 20–29)
Calcium: 8.9 mg/dL (ref 8.7–10.2)
Chloride: 98 mmol/L (ref 96–106)
Creatinine, Ser: 0.72 mg/dL — ABNORMAL LOW (ref 0.76–1.27)
Globulin, Total: 2.3 g/dL (ref 1.5–4.5)
Glucose: 83 mg/dL (ref 65–99)
Potassium: 4.4 mmol/L (ref 3.5–5.2)
Sodium: 138 mmol/L (ref 134–144)
Total Protein: 6.7 g/dL (ref 6.0–8.5)
eGFR: 121 mL/min/{1.73_m2} (ref >59)

## 2020-08-14 ENCOUNTER — Encounter (INDEPENDENT_AMBULATORY_CARE_PROVIDER_SITE_OTHER): Payer: Self-pay | Admitting: Primary Care

## 2020-11-24 ENCOUNTER — Emergency Department (HOSPITAL_COMMUNITY): Payer: No Typology Code available for payment source

## 2020-11-24 ENCOUNTER — Emergency Department (HOSPITAL_COMMUNITY)
Admission: EM | Admit: 2020-11-24 | Discharge: 2020-11-24 | Disposition: A | Discharge status: Home / Self Care | Payer: No Typology Code available for payment source | Attending: Emergency Medicine | Admitting: Emergency Medicine

## 2020-11-24 ENCOUNTER — Other Ambulatory Visit: Payer: Self-pay

## 2020-11-24 ENCOUNTER — Encounter (HOSPITAL_COMMUNITY): Payer: Self-pay | Admitting: Emergency Medicine

## 2020-11-24 DIAGNOSIS — Y92009 Unspecified place in unspecified non-institutional (private) residence as the place of occurrence of the external cause: Secondary | ICD-10-CM | POA: Insufficient documentation

## 2020-11-24 DIAGNOSIS — Z20822 Contact with and (suspected) exposure to covid-19: Secondary | ICD-10-CM | POA: Insufficient documentation

## 2020-11-24 DIAGNOSIS — S14109A Unspecified injury at unspecified level of cervical spinal cord, initial encounter: Secondary | ICD-10-CM | POA: Diagnosis present

## 2020-11-24 DIAGNOSIS — T1490XA Injury, unspecified, initial encounter: Secondary | ICD-10-CM

## 2020-11-24 DIAGNOSIS — R4182 Altered mental status, unspecified: Secondary | ICD-10-CM | POA: Diagnosis not present

## 2020-11-24 LAB — CBC
HCT: 44.4 % (ref 39.0–52.0)
Hemoglobin: 14.5 g/dL (ref 13.0–17.0)
MCH: 28.1 pg (ref 26.0–34.0)
MCHC: 32.7 g/dL (ref 30.0–36.0)
MCV: 86 fL (ref 80.0–100.0)
Platelets: 315 10*3/uL (ref 150–400)
RBC: 5.16 MIL/uL (ref 4.22–5.81)
RDW: 13.5 % (ref 11.5–15.5)
WBC: 5.4 10*3/uL (ref 4.0–10.5)
nRBC: 0 % (ref 0.0–0.2)

## 2020-11-24 LAB — COMPREHENSIVE METABOLIC PANEL
ALT: 14 U/L (ref 0–44)
AST: 17 U/L (ref 15–41)
Albumin: 3.8 g/dL (ref 3.5–5.0)
Alkaline Phosphatase: 76 U/L (ref 38–126)
Anion gap: 8 (ref 5–15)
BUN: 9 mg/dL (ref 6–20)
CO2: 28 mmol/L (ref 22–32)
Calcium: 8.8 mg/dL — ABNORMAL LOW (ref 8.9–10.3)
Chloride: 97 mmol/L — ABNORMAL LOW (ref 98–111)
Creatinine, Ser: 0.78 mg/dL (ref 0.61–1.24)
GFR, Estimated: >60 mL/min (ref >60)
Glucose, Bld: 120 mg/dL — ABNORMAL HIGH (ref 70–99)
Potassium: 3.7 mmol/L (ref 3.5–5.1)
Sodium: 133 mmol/L — ABNORMAL LOW (ref 135–145)
Total Bilirubin: 0.5 mg/dL (ref 0.3–1.2)
Total Protein: 6.9 g/dL (ref 6.5–8.1)

## 2020-11-24 LAB — LACTIC ACID, PLASMA: Lactic Acid, Venous: 1.8 mmol/L (ref 0.5–1.9)

## 2020-11-24 LAB — POCT I-STAT, CHEM 8
BUN: 11 mg/dL (ref 6–20)
Calcium, Ion: 1.01 mmol/L — ABNORMAL LOW (ref 1.15–1.40)
Chloride: 98 mmol/L (ref 98–111)
Creatinine, Ser: 0.6 mg/dL — ABNORMAL LOW (ref 0.61–1.24)
Glucose, Bld: 118 mg/dL — ABNORMAL HIGH (ref 70–99)
HCT: 53 % — ABNORMAL HIGH (ref 39.0–52.0)
Hemoglobin: 18 g/dL — ABNORMAL HIGH (ref 13.0–17.0)
Potassium: 4.1 mmol/L (ref 3.5–5.1)
Sodium: 134 mmol/L — ABNORMAL LOW (ref 135–145)
TCO2: 28 mmol/L (ref 22–32)

## 2020-11-24 LAB — RESP PANEL BY RT-PCR (FLU A&B, COVID) ARPGX2
Influenza A by PCR: NEGATIVE
Influenza B by PCR: NEGATIVE
SARS Coronavirus 2 by RT PCR: NEGATIVE

## 2020-11-24 LAB — SAMPLE TO BLOOD BANK

## 2020-11-24 LAB — PROTIME-INR
INR: 1 (ref 0.8–1.2)
Prothrombin Time: 13.3 seconds (ref 11.4–15.2)

## 2020-11-24 LAB — ETHANOL: Alcohol, Ethyl (B): <10 mg/dL (ref <10)

## 2020-11-24 MED ORDER — ONDANSETRON HCL 4 MG/2ML IJ SOLN
4.0000 mg | Freq: Once | INTRAMUSCULAR | Status: AC
  Administered 2020-11-24: 4 mg via INTRAVENOUS
  Filled 2020-11-24: qty 2

## 2020-11-24 MED ORDER — IOHEXOL 300 MG/ML  SOLN
100.0000 mL | Freq: Once | INTRAMUSCULAR | Status: AC | PRN
  Administered 2020-11-24: 100 mL via INTRAVENOUS

## 2020-11-24 MED ORDER — ONDANSETRON HCL 4 MG/2ML IJ SOLN: 4.0000 mg | Freq: Once | INTRAMUSCULAR | Status: DC

## 2020-11-24 MED ORDER — NALOXONE HCL 0.4 MG/ML IJ SOLN
0.4000 mg | Freq: Once | INTRAMUSCULAR | Status: DC
Start: 2020-11-24 — End: 2020-11-24

## 2020-11-24 NOTE — ED Notes (Signed)
Pt taken to CT.
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
## 2020-11-24 NOTE — ED Notes (Signed)
Xray at bedside. Pt beginning to arouse- answering his name correctly
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
## 2020-11-24 NOTE — ED Notes (Signed)
Attempted to ambulate pt. Pt would only open eyes and look at NT and then roll over. RN notified.
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
## 2020-11-24 NOTE — ED Notes (Signed)
Pt easily aroused to voice, c/o nausea, stood without difficulty.

## 2020-11-24 NOTE — ED Notes (Signed)
Pt vomited bile colored emesis on floor. Pt states he is no longer nauseated. NT and this RN cleaned pt up and changed linens.

## 2020-11-25 ENCOUNTER — Encounter (INDEPENDENT_AMBULATORY_CARE_PROVIDER_SITE_OTHER): Payer: Self-pay | Admitting: Primary Care

## 2021-08-08 ENCOUNTER — Encounter (INDEPENDENT_AMBULATORY_CARE_PROVIDER_SITE_OTHER): Payer: Self-pay | Admitting: Primary Care

## 2021-08-08 ENCOUNTER — Ambulatory Visit (INDEPENDENT_AMBULATORY_CARE_PROVIDER_SITE_OTHER): Payer: Self-pay | Admitting: Primary Care

## 2021-08-08 VITALS — BP 126/79 | HR 101 | Temp 97.5°F | Ht 72.0 in | Wt 189.8 lb

## 2021-08-08 DIAGNOSIS — F17210 Nicotine dependence, cigarettes, uncomplicated: Secondary | ICD-10-CM

## 2021-08-08 DIAGNOSIS — F1920 Other psychoactive substance dependence, uncomplicated: Secondary | ICD-10-CM

## 2021-08-08 DIAGNOSIS — Z720 Tobacco use: Secondary | ICD-10-CM

## 2021-08-08 DIAGNOSIS — J4520 Mild intermittent asthma, uncomplicated: Secondary | ICD-10-CM

## 2021-08-08 DIAGNOSIS — Z131 Encounter for screening for diabetes mellitus: Secondary | ICD-10-CM

## 2021-08-08 DIAGNOSIS — F419 Anxiety disorder, unspecified: Secondary | ICD-10-CM

## 2021-08-08 DIAGNOSIS — R7303 Prediabetes: Secondary | ICD-10-CM

## 2021-08-08 DIAGNOSIS — Z760 Encounter for issue of repeat prescription: Secondary | ICD-10-CM

## 2021-08-08 LAB — POCT GLYCOSYLATED HEMOGLOBIN (HGB A1C): Hemoglobin A1C: 5.7 % — AB (ref 4.0–5.6)

## 2021-08-08 MED ORDER — ATROVENT HFA 17 MCG/ACT IN AERS: 2.0000 | INHALATION_SPRAY | Freq: Four times a day (QID) | RESPIRATORY_TRACT | 12 refills | Status: AC | PRN

## 2021-08-08 MED ORDER — ALBUTEROL SULFATE HFA 108 (90 BASE) MCG/ACT IN AERS: 2.0000 | INHALATION_SPRAY | Freq: Four times a day (QID) | RESPIRATORY_TRACT | 1 refills | Status: AC | PRN

## 2021-08-08 MED ORDER — ESCITALOPRAM OXALATE 20 MG PO TABS: 20.0000 mg | ORAL_TABLET | Freq: Every day | ORAL | 1 refills | Status: AC

## 2021-08-08 NOTE — Progress Notes (Signed)
?[facility] ? ?Craig Woodward, is a 41 y.o. male ? ?QMG:[id number] ? ?TOI:[id number] ? ?DOB - 10/29/1980 ? ?Chief Complaint  ?Patient presents with  ? forms  ?    ? ?Subjective:  ? ?[name] is a 41 y.o. 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 Reviewed his chart and has and continue to have a drug abuse problem. Inform patient PCP unable to fill out form but could refer to drug rehab . He states ok "I need it". Patient has No headache, No chest pain, No abdominal pain - No Nausea, No new weakness tingling or numbness, No Cough - shortness of breath ? ?No problems updated. ? ?Allergies  ?Allergen Reactions  ? Penicillin G Anaphylaxis  ?  Airway swelling ?Per pt: he thinks he is allergic  ? ? ?Past Medical History:  ?Diagnosis Date  ? ADHD (attention deficit hyperactivity disorder) 12/31/2011  ? Allergy   ? Asthma   ? childhood  ? Back pain   ? ? ?Current Outpatient Medications on File Prior to Visit  ?Medication Sig Dispense Refill  ? acetaminophen (TYLENOL) 500 MG tablet Take 1,000 mg by mouth every 6 (six) hours as needed for moderate pain. (Patient not taking: Reported on 08/08/2021)    ? ?No current facility-administered medications on file prior to visit.  ? ? ?Objective:  ? ?Vitals:  ? 08/08/21 0840  ?BP: 126/79  ?Pulse: (!) 101  ?Temp: (!) 97.5 ?F (36.4 ?C)  ?TempSrc: Oral  ?SpO2: 94%  ?Weight: 189 lb 12.8 oz (86.1 kg)  ?Height: 6' (1.829 m)  ? ? ?Exam ?General appearance : Awake, alert, not in any distress. Speech Clear. Not toxic looking ?HEENT: Atraumatic and Normocephalic, pupils equally reactive to light and accomodation. Missing teeth. ?Neck: Supple, no JVD. No cervical lymphadenopathy.  ?Chest: Good air entry bilaterally, no added sounds  ?CVS: S1 S2 regular, no murmurs.  ?Abdomen: Bowel sounds present, Non tender and not distended with no gaurding, rigidity or rebound. ?Extremities: B/L Lower Ext shows no edema,  both legs are warm to touch ?Neurology: Awake alert, and oriented X 3, , Non focal ?Skin: No Rash ? ?Data Review ?Lab Results  ?Component Value Date  ? HGBA1C 5.7 (A) 08/08/2021  ? HGBA1C 5.6 08/11/2020  ? HGBA1C 5.4 01/14/2019  ? ? ?Assessment & Plan  ? ?1. Screening for diabetes mellitus ?- HgB A1c 5.7  ? ?Prediabetes ?Explained 5.7-6.4-  Monitor foods that are high in carbohydrates are the following rice, potatoes, breads, sugars, and pastas.  Reduction in the intake (eating) will assist in lowering your blood sugars. No medication prescribed at this time ? ?2. Medication refill ?- escitalopram (LEXAPRO) 20 MG tablet; Take 1 tablet (20 mg total) by mouth daily.  Dispense: 90 tablet; Refill: 1 ?- ipratropium (ATROVENT HFA) 17 MCG/ACT inhaler; Inhale 2 puffs into the lungs every 6 (six) hours as needed for wheezing.  Dispense: 1 each; Refill: 12 ?- albuterol (VENTOLIN HFA) 108 (90 Base) MCG/ACT inhaler; Inhale 2 puffs into the lungs every 6 (six) hours as needed for wheezing or shortness of breath.  Dispense: 18 g; Refill: 1 ? ?3. Mild intermittent asthma without complication ?- ipratropium (ATROVENT HFA) 17 MCG/ACT inhaler; Inhale 2 puffs into the lungs every 6 (six) hours as needed for wheezing.  Dispense: 1 each; Refill: 12 ?- albuterol (VENTOLIN HFA) 108 (90 Base) MCG/ACT inhaler; Inhale 2 puffs into the lungs every 6 (six) hours  as needed for wheezing or shortness of breath.  Dispense: 18 g; Refill: 1 ? ?4. Tobacco abuse ?- I have recommended complete cessation of tobacco use. I have discussed various options available for assistance with tobacco cessation including over the counter methods (Nicotine gum, patch and lozenges). We also discussed prescription options (Chantix, Nicotine Inhaler / Nasal Spray). The patient is not interested in pursuing any prescription tobacco cessation options at this time. ?- Patient declines at this time.  ? ?5. Anxiety ?Flowsheet Row Office Visit from 08/08/2021 in [facility]  ?PHQ-9 Total Score 17  ? ?  ?  ?- escitalopram (LEXAPRO) 20 MG tablet; Take 1 tablet (20 mg total) by mouth daily.  Dispense: 90 tablet; Refill: 1 ? ?6. Drug abuse and dependence (HCC) ?Internal Referral, Routine, BH-INTENSIVE CHEM DEP, Behavioral Health, Specialty Services Required ? ?Patient have been counseled extensively about nutrition and exercise. Other issues discussed during this visit include: low cholesterol diet, weight control and daily exercise, foot care, annual eye examinations at Ophthalmology, importance of adherence with medications and regular follow-up. We also discussed long term complications of uncontrolled diabetes and hypertension.  ? ?No follow-ups on file. ? ?The patient was given clear instructions to go to ER or return to medical center if symptoms don't improve, worsen or new problems develop. The patient verbalized understanding. The patient was told to call to get lab results if they haven't heard anything in the next week.  ? ?This note has been created with Education officer, environmental. Any transcriptional errors are unintentional.  ? ?Craig Sessions, NP ?08/08/2021, 11:15 AM  ?

## 2021-08-24 ENCOUNTER — Ambulatory Visit (INDEPENDENT_AMBULATORY_CARE_PROVIDER_SITE_OTHER): Payer: Self-pay | Admitting: Primary Care

## 2021-08-31 ENCOUNTER — Ambulatory Visit (INDEPENDENT_AMBULATORY_CARE_PROVIDER_SITE_OTHER): Payer: Self-pay | Admitting: Primary Care

## 2021-09-19 ENCOUNTER — Ambulatory Visit (INDEPENDENT_AMBULATORY_CARE_PROVIDER_SITE_OTHER): Payer: Self-pay | Admitting: Primary Care
